# Patient Record
Sex: Female | Born: 1965 | Race: Black or African American | Hispanic: No | Marital: Single | Smoking: Never smoker
Health system: Southern US, Community
[De-identification: ages and names within clinical notes are randomized; demographics above are authoritative.]

## PROBLEM LIST (undated history)

## (undated) DIAGNOSIS — K219 Gastro-esophageal reflux disease without esophagitis: Secondary | ICD-10-CM

## (undated) DIAGNOSIS — D509 Iron deficiency anemia, unspecified: Secondary | ICD-10-CM

## (undated) DIAGNOSIS — E785 Hyperlipidemia, unspecified: Secondary | ICD-10-CM

## (undated) DIAGNOSIS — E119 Type 2 diabetes mellitus without complications: Secondary | ICD-10-CM

## (undated) DIAGNOSIS — I10 Essential (primary) hypertension: Secondary | ICD-10-CM

## (undated) HISTORY — DX: Iron deficiency anemia, unspecified: D50.9

## (undated) HISTORY — PX: BREAST BIOPSY: SHX20

## (undated) HISTORY — DX: Gastro-esophageal reflux disease without esophagitis: K21.9

## (undated) HISTORY — DX: Essential (primary) hypertension: I10

## (undated) HISTORY — PX: COLONOSCOPY WITH ESOPHAGOGASTRODUODENOSCOPY (EGD): SHX5779

## (undated) HISTORY — DX: Type 2 diabetes mellitus without complications: E11.9

## (undated) HISTORY — DX: Hyperlipidemia, unspecified: E78.5

---

## 2006-11-07 ENCOUNTER — Ambulatory Visit: Payer: Self-pay | Admitting: Obstetrics and Gynecology

## 2009-03-11 ENCOUNTER — Ambulatory Visit: Payer: Self-pay | Admitting: Family Medicine

## 2009-04-05 ENCOUNTER — Ambulatory Visit: Payer: Self-pay | Admitting: Family Medicine

## 2009-04-30 ENCOUNTER — Ambulatory Visit: Payer: Self-pay | Admitting: Gastroenterology

## 2009-08-31 ENCOUNTER — Ambulatory Visit: Payer: Self-pay | Admitting: Family Medicine

## 2009-09-01 ENCOUNTER — Ambulatory Visit: Payer: Self-pay | Admitting: Gastroenterology

## 2009-10-15 ENCOUNTER — Ambulatory Visit: Payer: Self-pay | Admitting: Unknown Physician Specialty

## 2010-01-03 ENCOUNTER — Ambulatory Visit: Payer: Self-pay | Admitting: Internal Medicine

## 2013-03-10 ENCOUNTER — Ambulatory Visit: Payer: Self-pay | Admitting: Gastroenterology

## 2013-03-13 ENCOUNTER — Ambulatory Visit: Payer: Self-pay | Admitting: Gastroenterology

## 2014-06-25 DIAGNOSIS — E1165 Type 2 diabetes mellitus with hyperglycemia: Secondary | ICD-10-CM | POA: Insufficient documentation

## 2014-06-25 DIAGNOSIS — E559 Vitamin D deficiency, unspecified: Secondary | ICD-10-CM | POA: Insufficient documentation

## 2015-09-09 ENCOUNTER — Other Ambulatory Visit: Payer: Self-pay | Admitting: Obstetrics and Gynecology

## 2016-05-26 ENCOUNTER — Encounter (INDEPENDENT_AMBULATORY_CARE_PROVIDER_SITE_OTHER): Payer: Self-pay

## 2016-06-23 ENCOUNTER — Encounter (INDEPENDENT_AMBULATORY_CARE_PROVIDER_SITE_OTHER): Payer: Self-pay

## 2016-06-23 ENCOUNTER — Ambulatory Visit (INDEPENDENT_AMBULATORY_CARE_PROVIDER_SITE_OTHER): Payer: Self-pay | Admitting: Vascular Surgery

## 2018-09-15 ENCOUNTER — Other Ambulatory Visit: Payer: Self-pay

## 2018-09-15 DIAGNOSIS — I10 Essential (primary) hypertension: Secondary | ICD-10-CM | POA: Diagnosis not present

## 2018-09-15 DIAGNOSIS — Z794 Long term (current) use of insulin: Secondary | ICD-10-CM | POA: Diagnosis not present

## 2018-09-15 DIAGNOSIS — R202 Paresthesia of skin: Secondary | ICD-10-CM | POA: Insufficient documentation

## 2018-09-15 DIAGNOSIS — I6381 Other cerebral infarction due to occlusion or stenosis of small artery: Secondary | ICD-10-CM | POA: Diagnosis not present

## 2018-09-15 DIAGNOSIS — R739 Hyperglycemia, unspecified: Secondary | ICD-10-CM | POA: Diagnosis present

## 2018-09-15 DIAGNOSIS — Z79899 Other long term (current) drug therapy: Secondary | ICD-10-CM | POA: Diagnosis not present

## 2018-09-15 DIAGNOSIS — Z8249 Family history of ischemic heart disease and other diseases of the circulatory system: Secondary | ICD-10-CM | POA: Diagnosis not present

## 2018-09-15 DIAGNOSIS — E1165 Type 2 diabetes mellitus with hyperglycemia: Secondary | ICD-10-CM | POA: Diagnosis not present

## 2018-09-15 DIAGNOSIS — E785 Hyperlipidemia, unspecified: Secondary | ICD-10-CM | POA: Insufficient documentation

## 2018-09-15 NOTE — ED Triage Notes (Addendum)
Pt reports tongue numbness since yesterday around 2pm; now with numbness to left upper and lower lip, left cheek and left eye has "a vision change"; "I wouldn't say blurry, just different"; pt says also she's feeling unbalanced; pt says she had a similar episode about 5 years ago; symptoms stayed for about 2 hours and then resolved on their own; did not seek medical attention at that time; she says this time they symptoms have lasted much longer and "have spread"; pt talking in complete coherent sentences;

## 2018-09-16 ENCOUNTER — Emergency Department: Payer: Managed Care, Other (non HMO)

## 2018-09-16 ENCOUNTER — Observation Stay: Payer: Managed Care, Other (non HMO)

## 2018-09-16 ENCOUNTER — Other Ambulatory Visit: Payer: Self-pay

## 2018-09-16 ENCOUNTER — Observation Stay
Admit: 2018-09-16 | Discharge: 2018-09-16 | Disposition: A | Discharge status: Home / Self Care | Payer: Managed Care, Other (non HMO) | Attending: Internal Medicine | Admitting: Internal Medicine

## 2018-09-16 ENCOUNTER — Encounter: Payer: Self-pay | Admitting: Internal Medicine

## 2018-09-16 ENCOUNTER — Observation Stay
Admission: EM | Admit: 2018-09-16 | Discharge: 2018-09-16 | Disposition: A | Discharge status: Home / Self Care | Payer: Managed Care, Other (non HMO) | Attending: Internal Medicine | Admitting: Internal Medicine

## 2018-09-16 DIAGNOSIS — R202 Paresthesia of skin: Secondary | ICD-10-CM

## 2018-09-16 DIAGNOSIS — I6381 Other cerebral infarction due to occlusion or stenosis of small artery: Secondary | ICD-10-CM

## 2018-09-16 DIAGNOSIS — R739 Hyperglycemia, unspecified: Secondary | ICD-10-CM

## 2018-09-16 DIAGNOSIS — I10 Essential (primary) hypertension: Secondary | ICD-10-CM

## 2018-09-16 LAB — RETICULOCYTES
Immature Retic Fract: 14.3 % (ref 2.3–15.9)
RBC.: 5.46 MIL/uL — ABNORMAL HIGH (ref 3.87–5.11)
Retic Count, Absolute: 102.6 10*3/uL (ref 19.0–186.0)
Retic Ct Pct: 1.9 % (ref 0.4–3.1)

## 2018-09-16 LAB — FERRITIN: Ferritin: 63 ng/mL (ref 11–307)

## 2018-09-16 LAB — IRON AND TIBC
IRON: 37 ug/dL (ref 28–170)
Saturation Ratios: 11 % (ref 10.4–31.8)
TIBC: 332 ug/dL (ref 250–450)
UIBC: 295 ug/dL

## 2018-09-16 LAB — LIPID PANEL
Cholesterol: 228 mg/dL — ABNORMAL HIGH (ref 0–200)
HDL: 41 mg/dL (ref >40)
LDL Cholesterol: 169 mg/dL — ABNORMAL HIGH (ref 0–99)
Total CHOL/HDL Ratio: 5.6 RATIO
Triglycerides: 90 mg/dL (ref <150)
VLDL: 18 mg/dL (ref 0–40)

## 2018-09-16 LAB — CBC WITH DIFFERENTIAL/PLATELET
Abs Immature Granulocytes: 0.02 10*3/uL (ref 0.00–0.07)
BASOS ABS: 0 10*3/uL (ref 0.0–0.1)
Basophils Relative: 0 %
Eosinophils Absolute: 0.1 10*3/uL (ref 0.0–0.5)
Eosinophils Relative: 1 %
HEMATOCRIT: 43 % (ref 36.0–46.0)
Hemoglobin: 13.4 g/dL (ref 12.0–15.0)
IMMATURE GRANULOCYTES: 0 %
Lymphocytes Relative: 34 %
Lymphs Abs: 1.9 10*3/uL (ref 0.7–4.0)
MCH: 24.8 pg — ABNORMAL LOW (ref 26.0–34.0)
MCHC: 31.2 g/dL (ref 30.0–36.0)
MCV: 79.6 fL — ABNORMAL LOW (ref 80.0–100.0)
Monocytes Absolute: 0.4 10*3/uL (ref 0.1–1.0)
Monocytes Relative: 6 %
NEUTROS PCT: 59 %
NRBC: 0 % (ref 0.0–0.2)
Neutro Abs: 3.2 10*3/uL (ref 1.7–7.7)
Platelets: 304 10*3/uL (ref 150–400)
RBC: 5.4 MIL/uL — ABNORMAL HIGH (ref 3.87–5.11)
RDW: 16 % — ABNORMAL HIGH (ref 11.5–15.5)
WBC: 5.6 10*3/uL (ref 4.0–10.5)

## 2018-09-16 LAB — GLUCOSE, CAPILLARY
Glucose-Capillary: 263 mg/dL — ABNORMAL HIGH (ref 70–99)
Glucose-Capillary: 268 mg/dL — ABNORMAL HIGH (ref 70–99)

## 2018-09-16 LAB — COMPREHENSIVE METABOLIC PANEL
ALBUMIN: 3.7 g/dL (ref 3.5–5.0)
ALK PHOS: 67 U/L (ref 38–126)
ALT: 18 U/L (ref 0–44)
ANION GAP: 9 (ref 5–15)
AST: 18 U/L (ref 15–41)
BILIRUBIN TOTAL: 0.6 mg/dL (ref 0.3–1.2)
BUN: 13 mg/dL (ref 6–20)
CALCIUM: 9.9 mg/dL (ref 8.9–10.3)
CO2: 27 mmol/L (ref 22–32)
Chloride: 99 mmol/L (ref 98–111)
Creatinine, Ser: 0.89 mg/dL (ref 0.44–1.00)
GFR calc Af Amer: >60 mL/min (ref >60)
GLUCOSE: 370 mg/dL — AB (ref 70–99)
Potassium: 4.4 mmol/L (ref 3.5–5.1)
Sodium: 135 mmol/L (ref 135–145)
TOTAL PROTEIN: 7.5 g/dL (ref 6.5–8.1)

## 2018-09-16 LAB — TRANSFERRIN: Transferrin: 223 mg/dL (ref 192–382)

## 2018-09-16 LAB — HEMOGLOBIN A1C
HEMOGLOBIN A1C: 12.4 % — AB (ref 4.8–5.6)
Mean Plasma Glucose: 309.18 mg/dL

## 2018-09-16 LAB — MAGNESIUM: Magnesium: 2 mg/dL (ref 1.7–2.4)

## 2018-09-16 LAB — TSH: TSH: 3.394 u[IU]/mL (ref 0.350–4.500)

## 2018-09-16 LAB — PHOSPHORUS: Phosphorus: 4 mg/dL (ref 2.5–4.6)

## 2018-09-16 MED ORDER — ONDANSETRON HCL 4 MG/2ML IJ SOLN: 4.0000 mg | Freq: Four times a day (QID) | INTRAMUSCULAR | Status: DC | PRN

## 2018-09-16 MED ORDER — SENNOSIDES-DOCUSATE SODIUM 8.6-50 MG PO TABS: 1.0000 | ORAL_TABLET | Freq: Every evening | ORAL | Status: DC | PRN

## 2018-09-16 MED ORDER — ACETAMINOPHEN 325 MG PO TABS: 650.0000 mg | ORAL_TABLET | ORAL | Status: DC | PRN

## 2018-09-16 MED ORDER — INSULIN ASPART 100 UNIT/ML ~~LOC~~ SOLN: 0.0000 [IU] | Freq: Every day | SUBCUTANEOUS | Status: DC

## 2018-09-16 MED ORDER — INSULIN GLARGINE 100 UNIT/ML ~~LOC~~ SOLN
30.0000 [IU] | Freq: Every day | SUBCUTANEOUS | Status: DC
  Filled 2018-09-16: qty 0.3

## 2018-09-16 MED ORDER — SODIUM CHLORIDE 0.9 % IV BOLUS
1000.0000 mL | Freq: Once | INTRAVENOUS | Status: AC
  Administered 2018-09-16: 1000 mL via INTRAVENOUS

## 2018-09-16 MED ORDER — LOSARTAN POTASSIUM 50 MG PO TABS
100.0000 mg | ORAL_TABLET | Freq: Every day | ORAL | Status: DC
  Administered 2018-09-16: 100 mg via ORAL
  Filled 2018-09-16: qty 2

## 2018-09-16 MED ORDER — INSULIN ASPART 100 UNIT/ML ~~LOC~~ SOLN
0.0000 [IU] | Freq: Three times a day (TID) | SUBCUTANEOUS | Status: DC
  Administered 2018-09-16 (×2): 8 [IU] via SUBCUTANEOUS
  Filled 2018-09-16 (×3): qty 1

## 2018-09-16 MED ORDER — ACETAMINOPHEN 650 MG RE SUPP: 650.0000 mg | RECTAL | Status: DC | PRN

## 2018-09-16 MED ORDER — ASPIRIN 81 MG PO TBEC: 81.0000 mg | DELAYED_RELEASE_TABLET | Freq: Every day | ORAL | 0 refills | Status: DC

## 2018-09-16 MED ORDER — AMLODIPINE BESYLATE 5 MG PO TABS
5.0000 mg | ORAL_TABLET | Freq: Every day | ORAL | Status: DC
  Administered 2018-09-16: 5 mg via ORAL
  Filled 2018-09-16: qty 1

## 2018-09-16 MED ORDER — ENOXAPARIN SODIUM 40 MG/0.4ML ~~LOC~~ SOLN
40.0000 mg | Freq: Two times a day (BID) | SUBCUTANEOUS | Status: DC
  Administered 2018-09-16: 40 mg via SUBCUTANEOUS
  Filled 2018-09-16: qty 0.4

## 2018-09-16 MED ORDER — SODIUM CHLORIDE 0.9 % IV SOLN: INTRAVENOUS | Status: DC

## 2018-09-16 MED ORDER — SODIUM CHLORIDE 0.9 % IV BOLUS: 1000.0000 mL | Freq: Once | INTRAVENOUS | Status: DC

## 2018-09-16 MED ORDER — STROKE: EARLY STAGES OF RECOVERY BOOK
Freq: Once | Status: AC
  Administered 2018-09-16: 07:00:00

## 2018-09-16 MED ORDER — ASPIRIN EC 81 MG PO TBEC: 81.0000 mg | DELAYED_RELEASE_TABLET | Freq: Every day | ORAL | Status: DC

## 2018-09-16 MED ORDER — ACETAMINOPHEN 160 MG/5ML PO SOLN
650.0000 mg | ORAL | Status: DC | PRN
  Filled 2018-09-16: qty 20.3

## 2018-09-16 MED ORDER — ASPIRIN 81 MG PO CHEW
324.0000 mg | CHEWABLE_TABLET | Freq: Once | ORAL | Status: AC
  Administered 2018-09-16: 324 mg via ORAL
  Filled 2018-09-16: qty 4

## 2018-09-16 MED ORDER — ROSUVASTATIN CALCIUM 20 MG PO TABS: 40.0000 mg | ORAL_TABLET | Freq: Every day | ORAL | Status: DC

## 2018-09-16 MED ORDER — ROSUVASTATIN CALCIUM 20 MG PO TABS: 20.0000 mg | ORAL_TABLET | Freq: Every day | ORAL | 0 refills | Status: AC

## 2018-09-16 MED ORDER — BISACODYL 5 MG PO TBEC: 5.0000 mg | DELAYED_RELEASE_TABLET | Freq: Every day | ORAL | Status: DC | PRN

## 2018-09-16 NOTE — ED Provider Notes (Signed)
[facility] Emergency Department Provider Note   ____________________________________________   First MD Initiated Contact with Patient 09/16/18 0132     (approximate)  I have reviewed the triage vital signs and the nursing notes.   HISTORY  Chief Complaint Numbness    HPI Janica Eldred is a 53 y.o. female who presents to the ED from home with a chief complaint of left facial numbness.  Patient reports tongue numbness since yesterday around 2 PM.  Last evening developed numbness to her left upper and lower lip, left cheek.  Reports left eye just looks different.  Denies diplopia, blurry vision.  States she feels unbalanced.  Patient had a similar episode 5 years ago but did not seek medical treatment and symptoms resolved after 2 hours.  Patient denies slurred speech, facial droop, facial weakness, extremity weakness, numbness or tingling.  Denies headache, neck pain, chest pain, shortness of breath, abdominal pain, nausea or vomiting.  Denies recent fever or chills.  Denies recent travel or trauma.   Past Medical History:  Diagnosis Date  . Diabetes mellitus without complication ([place])   . Hyperlipidemia   . Hypertension     There are no active problems to display for this patient.   Past Surgical History:  Procedure Laterality Date  . COLONOSCOPY WITH ESOPHAGOGASTRODUODENOSCOPY (EGD)      Prior to Admission medications   Medication Sig Start Date End Date Taking? Authorizing Provider  amLODipine (NORVASC) 5 MG tablet Take 5 mg by mouth daily.   Yes [provider]  losartan (COZAAR) 100 MG tablet Take 100 mg by mouth daily.   Yes [provider]  metFORMIN (GLUCOPHAGE) 500 MG tablet Take 1,000 mg by mouth 2 (two) times daily with a meal.    Yes [provider]  rosuvastatin (CRESTOR) 10 MG tablet Take 10 mg by mouth daily.   Yes [provider]  simvastatin (ZOCOR) 40 MG tablet Take 40 mg by mouth daily.     [provider]    Allergies Sunscreens and Victoza [liraglutide]  Family History  Problem Relation Age of Onset  . Diabetes Mother   . Hypertension Mother   . Stroke Mother     Social History Social History   Tobacco Use  . Smoking status: Never Smoker  . Smokeless tobacco: Never Used  Substance Use Topics  . Alcohol use: No  . Drug use: Never    Review of Systems  Constitutional: No fever/chills Eyes: No visual changes. ENT: No sore throat. Cardiovascular: Denies chest pain. Respiratory: Denies shortness of breath. Gastrointestinal: No abdominal pain.  No nausea, no vomiting.  No diarrhea.  No constipation. Genitourinary: Negative for dysuria. Musculoskeletal: Negative for back pain. Skin: Negative for rash. Neurological: Negative for headaches, focal weakness.  Positive for left facial numbness.   ____________________________________________   PHYSICAL EXAM:  VITAL SIGNS: ED Triage Vitals  Enc Vitals Group     BP 09/15/18 2311 (!) 184/113     Pulse Rate 09/15/18 2311 (!) 102     Resp 09/15/18 2311 18     Temp 09/15/18 2311 98.5 F (36.9 C)     Temp Source 09/15/18 2311 Oral     SpO2 09/15/18 2311 97 %     Weight 09/15/18 2312 300 lb (136.1 kg)     Height 09/15/18 2312 5\' 7"  (1.702 m)     Head Circumference --      Peak Flow --      Pain Score 09/15/18 2323  0     Pain Loc --      Pain Edu? --      Excl. in [place]? --     Constitutional: Alert and oriented. Well appearing and in no acute distress.  Texting on her cell phone. Eyes: Conjunctivae are normal. PERRL. EOMI. Head: Atraumatic. Nose: No congestion/rhinnorhea. Mouth/Throat: Mucous membranes are moist.  Oropharynx non-erythematous. Neck: No stridor.  No carotid bruits. Cardiovascular: Normal rate, regular rhythm. Grossly normal heart sounds.  Good peripheral circulation. Respiratory: Normal respiratory effort.  No retractions. Lungs CTAB. Gastrointestinal: Soft and nontender. No  distention. No abdominal bruits. No CVA tenderness. Musculoskeletal: No lower extremity tenderness nor edema.  No joint effusions. Neurologic: Alert and oriented x3.  CN II-XII grossly intact.  Normal speech and language.  5/5 motor strength bilateral face.  Diminished sensation left forehead, cheek and mouth.  5/5 motor strength and sensation in all extremities. No gait instability. Skin:  Skin is warm, dry and intact. No rash noted. Psychiatric: Mood and affect are normal. Speech and behavior are normal.  ____________________________________________   LABS (all labs ordered are listed, but only abnormal results are displayed)  Labs Reviewed  CBC WITH DIFFERENTIAL/PLATELET - Abnormal; Notable for the following components:      Result Value   RBC 5.40 (*)    MCV 79.6 (*)    MCH 24.8 (*)    RDW 16.0 (*)    All other components within normal limits  COMPREHENSIVE METABOLIC PANEL - Abnormal; Notable for the following components:   Glucose, Bld 370 (*)    All other components within normal limits   ____________________________________________  EKG  None ____________________________________________  RADIOLOGY  ED MD interpretation: No ICH on CT scan; MRI demonstrates acute lacunar infarct  Official radiology report(s): Ct Head Wo Contrast  Result Date: 09/16/2018 CLINICAL DATA:  Numbness to tongue, lips, and left cheek. EXAM: CT HEAD WITHOUT CONTRAST TECHNIQUE: Contiguous axial images were obtained from the base of the skull through the vertex without intravenous contrast. COMPARISON:  None. FINDINGS: Brain: No intracranial hemorrhage, mass effect, or midline shift. No hydrocephalus. The basilar cisterns are patent. No evidence of territorial infarct or acute ischemia. No extra-axial or intracranial fluid collection. Vascular: No hyperdense vessel. Skull: No fracture or focal lesion. Sinuses/Orbits: Paranasal sinuses and mastoid air cells are clear. The visualized orbits are  unremarkable. Other: None. IMPRESSION: Unremarkable noncontrast head CT. Electronically Signed   By: Keith Rake M.D.   On: 09/16/2018 00:29   Mr Brain Wo Contrast  Result Date: 09/16/2018 CLINICAL DATA:  Facial numbness, side not specified. Trouble with vision and unbalanced feeling. EXAM: MRI HEAD WITHOUT CONTRAST TECHNIQUE: Multiplanar, multiecho pulse sequences of the brain and surrounding structures were obtained without intravenous contrast. COMPARISON:  Head CT from earlier today FINDINGS: Brain: Best seen on coronal diffusion acquisition there is a 6 mm area of restricted diffusion in the right para median midbrain. As permitted by fast protocol technique the remainder of the brain shows no evident demyelination or remote ischemia. Patient does have history of hypertension, diabetes, and hyperlipidemia. Brain volume is normal and there is no masslike finding, hydrocephalus, or collection. Vascular: Grossly preserved major flow voids. Skull and upper cervical spine: Sclerotic signal in the left paramedian clivus that is benign-appearing and stable from a 2010 cervical MRI. Sinuses/Orbits: Negative IMPRESSION: Subcentimeter focus of restricted diffusion in the right midbrain consistent with an acute lacunar infarct. No prior ischemic injury or demyelination is seen in the remainder of the brain.  Electronically Signed   By: Monte Fantasia M.D.   On: 09/16/2018 04:51    ____________________________________________   PROCEDURES  Procedure(s) performed: None  Procedures  Critical Care performed: No  ____________________________________________   INITIAL IMPRESSION / ASSESSMENT AND PLAN / ED COURSE  As part of my medical decision making, I reviewed the following data within the [place] notes reviewed and incorporated, Labs reviewed, Old chart reviewed, Radiograph reviewed and Notes from prior ED visits   53 year old female with diabetes who presents with  left facial numbness without weakness.  Frontal diagnosis includes but is not limited to CVA, TIA, ICH, Bell's palsy, electrolyte, metabolic etiologies, etc.  Laboratory results demonstrate hyperglycemia.  CT head is negative for intracranial hemorrhage.  Will proceed with brain MRI.  Ordered IV fluid resuscitation for hyperglycemia but patient refused.   Clinical Course as of Sep 16 456  Mon Sep 16, 2018  0457 Updated patient of MRI result.  Will discuss with hospitalist to evaluate patient in the emergency department for admission.   [JS]    Clinical Course User Index [JS] Paulette Blanch, MD     ____________________________________________   FINAL CLINICAL IMPRESSION(S) / ED DIAGNOSES  Final diagnoses:  Paresthesia  Hyperglycemia  Essential hypertension  Acute lacunar infarction [facility])     ED Discharge Orders    None       Note:  This document was prepared using Dragon voice recognition software and may include unintentional dictation errors.    Paulette Blanch, MD 09/16/18 [phone]

## 2018-09-16 NOTE — Progress Notes (Signed)
*  PRELIMINARY RESULTS* Echocardiogram 2D Echocardiogram has been performed.  Rhonda Mccall 09/16/2018, 8:50 AM

## 2018-09-16 NOTE — ED Notes (Signed)
Iv team here 

## 2018-09-16 NOTE — Consult Note (Addendum)
Referring Physician: Loletha Grayer MD.    Chief Complaint: Left lower facial numbness and vision changes  HPI: Rhonda Mccall is an 53 y.o. female with past medical history of hypertension, hyperlipidemia, uncontrolled diabetes mellitus and morbid obesity presenting to the ED on 09/15/2018 with chief complaints of left lower face numbness including the lip and tongue associated with left vision changes. Patient report onset of symptoms on 09/14/2018 at around 1500 pm with sudden onset tongue numbness. Patient report similar episode 5 years ago with immediate resolution, however she noticed that her current symptoms were persistent with associated lower facial numbness, vision changes and imbalance.  Denies associated altered sensorium, speech abnormality, cranial nerve deficit, seizures, focal motor deficits, nausea or vomiting, dizziness, headache, syncope or LOC, or other focal neurological deficit. On arrival to the ED she was noted to have elevated blood pressure. Initial NIH stroke scale was 1. Initial CT head did not show any acute intracranial abnormality. She was therefore admitted for further stroke work up. Follow up MRI brain showed acute lacunar infarct in the right midbrain.  Date last known well: Date: 09/14/2018 Time last known well: Time: 15:00 tPA Given: No: outside time window, non-disabling symptoms  Past Medical History:  Diagnosis Date  . Diabetes mellitus without complication ([place])   . Hyperlipidemia   . Hypertension     Past Surgical History:  Procedure Laterality Date  . COLONOSCOPY WITH ESOPHAGOGASTRODUODENOSCOPY (EGD)      Family History  Problem Relation Age of Onset  . Diabetes Mother   . Hypertension Mother   . Stroke Mother    Social History:  reports that she has never smoked. She has never used smokeless tobacco. She reports that she does not drink alcohol or use drugs.  Allergies:  Allergies  Allergen Reactions  . Sunscreens Hives  . Victoza  [Liraglutide] Other (See Comments)    Yeast infections    Medications:  I have reviewed the patient's current medications. Prior to Admission:  Medications Prior to Admission  Medication Sig Dispense Refill Last Dose  . amLODipine (NORVASC) 5 MG tablet Take 5 mg by mouth daily.   09/15/2018 at 1000  . Dulaglutide 1.5 MG/0.5ML SOPN Inject 1.5 mg into the skin once a week.   09/09/2018 at Unknown  . Insulin Degludec 200 UNIT/ML SOPN Inject 30 Units into the skin daily.   09/15/2018 at 0800  . losartan (COZAAR) 100 MG tablet Take 100 mg by mouth daily.   09/15/2018 at 1000  . metFORMIN (GLUCOPHAGE) 500 MG tablet Take 1,000 mg by mouth 2 (two) times daily with a meal.    09/15/2018 at 1000  . rosuvastatin (CRESTOR) 10 MG tablet Take 10 mg by mouth daily.   09/15/2018 at 1000   Scheduled: . amLODipine  5 mg Oral Daily  . enoxaparin (LOVENOX) injection  40 mg Subcutaneous BID  . insulin aspart  0-15 Units Subcutaneous TID WC  . insulin aspart  0-5 Units Subcutaneous QHS  . insulin glargine  30 Units Subcutaneous QHS  . losartan  100 mg Oral Daily  . rosuvastatin  40 mg Oral Daily    ROS: History obtained from the patient   General ROS: negative for - chills, fatigue, fever, night sweats, weight gain or weight loss Psychological ROS: negative for - behavioral disorder, hallucinations, memory difficulties, mood swings or suicidal ideation Ophthalmic ROS: negative for -  double vision, eye pain or loss of vision. Positive for blurry vision. ENT ROS: negative for - epistaxis, nasal  discharge, oral lesions, sore throat, tinnitus or vertigo Allergy and Immunology ROS: negative for - hives or itchy/watery eyes Hematological and Lymphatic ROS: negative for - bleeding problems, bruising or swollen lymph nodes Endocrine ROS: negative for - galactorrhea, hair pattern changes, polydipsia/polyuria or temperature intolerance Respiratory ROS: negative for - cough, hemoptysis, shortness of breath or  wheezing Cardiovascular ROS: negative for - chest pain, dyspnea on exertion, edema or irregular heartbeat Gastrointestinal ROS: negative for - abdominal pain, diarrhea, hematemesis, nausea/vomiting or stool incontinence Genito-Urinary ROS: negative for - dysuria, hematuria, incontinence or urinary frequency/urgency Musculoskeletal ROS: negative for - joint swelling or muscular weakness Neurological ROS: as noted in HPI Dermatological ROS: negative for rash and skin lesion changes  Physical Examination: Blood pressure (!) 152/91, pulse 83, temperature 97.8 F (36.6 C), resp. rate 14, height 5\' 7"  (1.702 m), weight 129.7 kg, last menstrual period 08/30/2018, SpO2 98 %.   HEENT-  Normocephalic, no lesions, without obvious abnormality.  Normal external eye and conjunctiva.  Normal TM's bilaterally.  Normal auditory canals and external ears. Normal external nose, mucus membranes and septum.  Normal pharynx. Cardiovascular- S1, S2 normal, pulses palpable throughout   Lungs- chest clear, no wheezing, rales, normal symmetric air entry Abdomen- soft, non-tender; bowel sounds normal; no masses,  no organomegaly Extremities- no edema Lymph-no adenopathy palpable Musculoskeletal-no joint tenderness, deformity or swelling Skin-warm and dry, no hyperpigmentation, vitiligo, or suspicious lesions  Neurological Exam   Mental Status: Alert, oriented, thought content appropriate.  Speech fluent without evidence of aphasia.  Able to follow 3 step commands without difficulty. Attention span and concentration seemed appropriate  Cranial Nerves: II: Discs flat bilaterally; Visual fields grossly normal, pupils equal, round, reactive to light and accommodation III,IV, VI: ptosis not present, extra-ocular motions intact bilaterally V,VII: smile symmetric, facial light touch sensation intact VIII: hearing normal bilaterally IX,X: gag reflex present XI: bilateral shoulder shrug XII: midline tongue  extension Motor: Right :  Upper extremity   5/5 Without pronator drift      Left: Upper extremity   5/5 without pronator drift Right:   Lower extremity   5/5                                          Left: Lower extremity   5/5 Tone and bulk:normal tone throughout; no atrophy noted Sensory: Pinprick and light touch intact bilaterally Deep Tendon Reflexes: 2+ and symmetric throughout Plantars: Right: downgoing                              Left: downgoing Cerebellar: Finger-to-nose testing intact bilaterally. Heel to shin testing normal bilaterally Gait: not tested due to safety concerns  Data Reviewed  Laboratory Studies:  Basic Metabolic Panel: Recent Labs  Lab 09/15/18 2342  NA 135  K 4.4  CL 99  CO2 27  GLUCOSE 370*  BUN 13  CREATININE 0.89  CALCIUM 9.9  MG 2.0  PHOS 4.0    Liver Function Tests: Recent Labs  Lab 09/15/18 2342  AST 18  ALT 18  ALKPHOS 67  BILITOT 0.6  PROT 7.5  ALBUMIN 3.7   No results for input(s): LIPASE, AMYLASE in the last 168 hours. No results for input(s): AMMONIA in the last 168 hours.  CBC: Recent Labs  Lab 09/15/18 2342  WBC 5.6  NEUTROABS 3.2  HGB 13.4  HCT 43.0  MCV 79.6*  PLT 304    Cardiac Enzymes: No results for input(s): CKTOTAL, CKMB, CKMBINDEX, TROPONINI in the last 168 hours.  BNP: Invalid input(s): POCBNP  CBG: Recent Labs  Lab 09/16/18 0914  GLUCAP 263*    Microbiology: No results found for this or any previous visit.  Coagulation Studies: No results for input(s): LABPROT, INR in the last 72 hours.  Urinalysis: No results for input(s): COLORURINE, LABSPEC, PHURINE, GLUCOSEU, HGBUR, BILIRUBINUR, KETONESUR, PROTEINUR, UROBILINOGEN, NITRITE, LEUKOCYTESUR in the last 168 hours.  Invalid input(s): APPERANCEUR  Lipid Panel: No results found for: CHOL, TRIG, HDL, CHOLHDL, VLDL, LDLCALC  HgbA1C: No results found for: HGBA1C  Urine Drug Screen:  No results found for: LABOPIA, COCAINSCRNUR, LABBENZ,  AMPHETMU, THCU, LABBARB  Alcohol Level: No results for input(s): ETH in the last 168 hours.  Other results: EKG: normal EKG, normal sinus rhythm, unchanged from previous tracings.  Imaging: Ct Head Wo Contrast  Result Date: 09/16/2018 CLINICAL DATA:  Numbness to tongue, lips, and left cheek. EXAM: CT HEAD WITHOUT CONTRAST TECHNIQUE: Contiguous axial images were obtained from the base of the skull through the vertex without intravenous contrast. COMPARISON:  None. FINDINGS: Brain: No intracranial hemorrhage, mass effect, or midline shift. No hydrocephalus. The basilar cisterns are patent. No evidence of territorial infarct or acute ischemia. No extra-axial or intracranial fluid collection. Vascular: No hyperdense vessel. Skull: No fracture or focal lesion. Sinuses/Orbits: Paranasal sinuses and mastoid air cells are clear. The visualized orbits are unremarkable. Other: None. IMPRESSION: Unremarkable noncontrast head CT. Electronically Signed   By: Keith Rake M.D.   On: 09/16/2018 00:29   Mr Brain Wo Contrast  Result Date: 09/16/2018 CLINICAL DATA:  Facial numbness, side not specified. Trouble with vision and unbalanced feeling. EXAM: MRI HEAD WITHOUT CONTRAST TECHNIQUE: Multiplanar, multiecho pulse sequences of the brain and surrounding structures were obtained without intravenous contrast. COMPARISON:  Head CT from earlier today FINDINGS: Brain: Best seen on coronal diffusion acquisition there is a 6 mm area of restricted diffusion in the right para median midbrain. As permitted by fast protocol technique the remainder of the brain shows no evident demyelination or remote ischemia. Patient does have history of hypertension, diabetes, and hyperlipidemia. Brain volume is normal and there is no masslike finding, hydrocephalus, or collection. Vascular: Grossly preserved major flow voids. Skull and upper cervical spine: Sclerotic signal in the left paramedian clivus that is benign-appearing and stable  from a 2010 cervical MRI. Sinuses/Orbits: Negative IMPRESSION: Subcentimeter focus of restricted diffusion in the right midbrain consistent with an acute lacunar infarct. No prior ischemic injury or demyelination is seen in the remainder of the brain. Electronically Signed   By: Monte Fantasia M.D.   On: 09/16/2018 04:51   US Carotid Bilateral (at [facility] Only)  Result Date: 09/16/2018 CLINICAL DATA:  Lacunar infarction EXAM: BILATERAL CAROTID DUPLEX ULTRASOUND TECHNIQUE: Pearline Cables scale imaging, color Doppler and duplex ultrasound were performed of bilateral carotid and vertebral arteries in the neck. COMPARISON:  None. FINDINGS: Criteria: Quantification of carotid stenosis is based on velocity parameters that correlate the residual internal carotid diameter with NASCET-based stenosis levels, using the diameter of the distal internal carotid lumen as the denominator for stenosis measurement. The following velocity measurements were obtained: RIGHT ICA: 50 cm/sec CCA: 73 cm/sec SYSTOLIC ICA/CCA RATIO:  0.7 ECA: 66 cm/sec LEFT ICA: 53 cm/sec CCA: 64 cm/sec SYSTOLIC ICA/CCA RATIO:  0.8 ECA: 43 cm/sec RIGHT CAROTID ARTERY: Mild intimal thickening in the upper common carotid  and bulb. Low resistance internal carotid Doppler pattern is preserved. RIGHT VERTEBRAL ARTERY:  Antegrade. LEFT CAROTID ARTERY: Mild intimal thickening in the upper common carotid artery and bulb. Low resistance internal carotid Doppler pattern is preserved. LEFT VERTEBRAL ARTERY:  Antegrade. IMPRESSION: Less than 50% stenosis in the right and left internal carotid arteries. Electronically Signed   By: Marybelle Killings M.D.   On: 09/16/2018 08:39   Patient seen and examined.  Clinical course and management discussed.  Necessary edits performed.  I agree with the above.  Assessment and plan of care developed and discussed below.    Assessment: 53 y.o. female with past medical history of hypertension, hyperlipidemia, uncontrolled diabetes mellitus  and morbid obesity presenting to the ED on 09/15/2018 with chief complaints of left lower face numbness including the lip and tongue associated with left vision changes. Symptoms now resolved. MRI brain reviewed and showed acute lacunar infarct in the right midbrain. Etiology likely small vessel disease. US carotid bilateral did not show hemodynamically significant stenosis. Patient report that she was not taking any anticoagulant or antiplatelet prior to this episode. She also has uncontrolled blood pressure with elevated reading on presentation and uncontrolled diabetes mellitus with most recent hemoglobin A1c of 8.0.  Current A1c and LDL pending.  Echocardiogram pending.    Stroke Risk Factors - diabetes mellitus, family history, hyperlipidemia and hypertension  Plan: 1. HgbA1c, fasting lipid panel 2. Start Aspirin 81 mg/day with intensive management of vascular risk factor to keep systolic BP (SBP) <423 mm Hg (130 mm Hg if diabetic) 3. Lipid management with goal low density lipoprotein (LDL) <70 mg/dl 4. Glycemic control with goal HgbA1c <7 5. PT consult, OT consult, Speech consult 6. Echocardiogram pending 7. NPO until RN stroke swallow screen 8. Telemetry monitoring 9. Frequent neuro checks 10. Patient to follow up with neurology on an outpatient basis.    This patient was staffed with Dr. Magda Paganini, [place] Mince who personally evaluated patient, reviewed documentation and agreed with assessment and plan of care as above.  Rufina Falco, DNP, FNP-BC Board certified Nurse Practitioner Neurology Department  09/16/2018, 11:14 AM    Alexis Goodell, MD Neurology [phone]  09/16/2018  12:54 PM

## 2018-09-16 NOTE — Progress Notes (Signed)
Reviewed AVS with pt, stroke education handout provided. Pt verbalized understanding.

## 2018-09-16 NOTE — H&P (Signed)
[facility] [place] NAME: Rhonda Mccall    MR#:  [id number]  DATE OF BIRTH:  1966-03-05  DATE OF ADMISSION:  09/16/2018  PRIMARY CARE PHYSICIAN: Patient, No Pcp Per   REQUESTING/REFERRING PHYSICIAN: Paulette Blanch, MD  CHIEF COMPLAINT:   Chief Complaint  Patient presents with  . Numbness    HISTORY OF PRESENT ILLNESS:  Rhonda Mccall  is a 53 y.o. female with a known history of morbid obesity, T2IDDM, HTN, HLD p/w tongue/L facial numbness, MRI brain (+) R midbrain lacunar infarct. Denies focal weakness of any variety. She states her sugar has been persistently elevated for the past week or so. She has been feeling fatigued/tired, but was otherwise in her usual state of health up until Saturday (09/14/2018) afternoon. @~1500PM, pt states she developed sudden-onset tongue numbness. She states she has had this issue the past, but it would typically resolve spontaneously after several hours. She denies pain/spasm/fasciculation of the tongue, tongue weakness, slurred speech or difficulty speaking. She states the numbness was persistent, however. She cannot tell me much about the progression of her symptoms between Saturday (09/14/2018) evening and Sunday (09/15/2018) evening, but states that she noticed on Sunday evening that the numbness began to extend laterally, to involve the face/skin over the L mandible, L maxilla and lateral to the L eye. She states her eyelid "feels funny", but denies blurred/double vision or eye pain; there is no strabismus or gaze palsy on exam. She denies numbness over the L forehead. Apart from above-mentioned tongue + L facial sensory deficit, pt's neurological exam is non-focal. She is comfortable and otherwise w/o complaint. MRI brain (+) "Subcentimeter focus of restricted diffusion in the right midbrain consistent with an acute lacunar infarct."  PAST MEDICAL HISTORY:   Past Medical History:  Diagnosis Date  . Diabetes  mellitus without complication ([place])   . Hyperlipidemia   . Hypertension     PAST SURGICAL HISTORY:   Past Surgical History:  Procedure Laterality Date  . COLONOSCOPY WITH ESOPHAGOGASTRODUODENOSCOPY (EGD)      SOCIAL HISTORY:   Social History   Tobacco Use  . Smoking status: Never Smoker  . Smokeless tobacco: Never Used  Substance Use Topics  . Alcohol use: No    FAMILY HISTORY:   Family History  Problem Relation Age of Onset  . Diabetes Mother   . Hypertension Mother   . Stroke Mother     DRUG ALLERGIES:   Allergies  Allergen Reactions  . Sunscreens Hives  . Victoza [Liraglutide] Other (See Comments)    Yeast infections    REVIEW OF SYSTEMS:   Review of Systems  Constitutional: Positive for malaise/fatigue. Negative for chills, diaphoresis, fever and weight loss.  HENT: Negative for congestion, ear pain, hearing loss, nosebleeds, sinus pain, sore throat and tinnitus.   Eyes: Negative for blurred vision, double vision and photophobia.  Respiratory: Negative for cough, hemoptysis, sputum production, shortness of breath and wheezing.   Cardiovascular: Negative for chest pain, palpitations, orthopnea, claudication, leg swelling and PND.  Gastrointestinal: Negative for abdominal pain, blood in stool, constipation, diarrhea, heartburn, melena, nausea and vomiting.  Genitourinary: Negative for dysuria, frequency, hematuria and urgency.  Musculoskeletal: Negative for back pain, joint pain, myalgias and neck pain.  Skin: Negative for itching and rash.  Neurological: Positive for sensory change. Negative for dizziness, tingling, tremors, speech change, focal weakness, seizures, loss of consciousness, weakness and headaches.  Psychiatric/Behavioral: Negative for depression and memory loss. The patient  is not nervous/anxious and does not have insomnia.    MEDICATIONS AT HOME:   Prior to Admission medications   Medication Sig Start Date End Date Taking? Authorizing  Provider  amLODipine (NORVASC) 5 MG tablet Take 5 mg by mouth daily.   Yes [provider]  Dulaglutide 1.5 MG/0.5ML SOPN Inject 1.5 mg into the skin once a week. 04/05/18  Yes [provider]  Insulin Degludec 200 UNIT/ML SOPN Inject 30 Units into the skin daily. 11/29/17  Yes [provider]  losartan (COZAAR) 100 MG tablet Take 100 mg by mouth daily.   Yes [provider]  metFORMIN (GLUCOPHAGE) 500 MG tablet Take 1,000 mg by mouth 2 (two) times daily with a meal.    Yes [provider]  rosuvastatin (CRESTOR) 10 MG tablet Take 10 mg by mouth daily.   Yes [provider]      VITAL SIGNS:  Blood pressure (!) 151/93, pulse 92, temperature 98.5 F (36.9 C), temperature source Oral, resp. rate 16, height 5\' 7"  (1.702 m), weight 136.1 kg, last menstrual period 08/30/2018, SpO2 97 %.  PHYSICAL EXAMINATION:  Physical Exam Constitutional:      General: She is awake. She is not in acute distress.    Appearance: Normal appearance. She is well-developed and normal weight. She is not ill-appearing, toxic-appearing or diaphoretic.     Interventions: She is not intubated. HENT:     Head: Atraumatic.     Mouth/Throat:     Mouth: Mucous membranes are dry.     Pharynx: Oropharynx is clear.  Eyes:     General: Lids are normal. No scleral icterus.    Extraocular Movements: Extraocular movements intact.     Conjunctiva/sclera: Conjunctivae normal.  Neck:     Musculoskeletal: Neck supple.  Cardiovascular:     Rate and Rhythm: Normal rate and regular rhythm.  No extrasystoles are present.    Heart sounds: Normal heart sounds, S1 normal and S2 normal. Heart sounds not distant. No murmur. No friction rub. No gallop. No S3 or S4 sounds.   Pulmonary:     Effort: No tachypnea, bradypnea, accessory muscle usage, prolonged expiration, respiratory distress or retractions. She is not intubated.     Breath sounds: Normal breath sounds and air entry. No  stridor, decreased air movement or transmitted upper airway sounds. No decreased breath sounds, wheezing, rhonchi or rales.  Abdominal:     General: Bowel sounds are decreased. There is no distension.     Palpations: Abdomen is soft.     Tenderness: There is no abdominal tenderness. There is no guarding or rebound.  Musculoskeletal: Normal range of motion.        General: No swelling or tenderness.     Right lower leg: No edema.     Left lower leg: No edema.  Lymphadenopathy:     Cervical: No cervical adenopathy.  Skin:    General: Skin is warm and dry.     Findings: No erythema or rash.  Neurological:     Mental Status: She is alert and oriented to person, place, and time.     Cranial Nerves: Cranial nerves are intact.     Sensory: Sensory deficit present.     Motor: Motor function is intact.     Coordination: Coordination is intact.  Psychiatric:        Attention and Perception: Attention and perception normal.        Mood and Affect: Mood normal.  Speech: Speech normal.        Behavior: Behavior normal. Behavior is cooperative.        Thought Content: Thought content normal.        Cognition and Memory: Cognition and memory normal.        Judgment: Judgment normal.    (+) sensory deficit (crude touch) L face (w/ sparing of L forehead). Strength, reflex, cerebellar, CN exams otherwise WNL. LABORATORY PANEL:   CBC Recent Labs  Lab 09/15/18 2342  WBC 5.6  HGB 13.4  HCT 43.0  PLT 304   ------------------------------------------------------------------------------------------------------------------  Chemistries  Recent Labs  Lab 09/15/18 2342  NA 135  K 4.4  CL 99  CO2 27  GLUCOSE 370*  BUN 13  CREATININE 0.89  CALCIUM 9.9  AST 18  ALT 18  ALKPHOS 67  BILITOT 0.6   ------------------------------------------------------------------------------------------------------------------  Cardiac Enzymes No results for input(s): TROPONINI in the last 168  hours. ------------------------------------------------------------------------------------------------------------------  RADIOLOGY:  Ct Head Wo Contrast  Result Date: 09/16/2018 CLINICAL DATA:  Numbness to tongue, lips, and left cheek. EXAM: CT HEAD WITHOUT CONTRAST TECHNIQUE: Contiguous axial images were obtained from the base of the skull through the vertex without intravenous contrast. COMPARISON:  None. FINDINGS: Brain: No intracranial hemorrhage, mass effect, or midline shift. No hydrocephalus. The basilar cisterns are patent. No evidence of territorial infarct or acute ischemia. No extra-axial or intracranial fluid collection. Vascular: No hyperdense vessel. Skull: No fracture or focal lesion. Sinuses/Orbits: Paranasal sinuses and mastoid air cells are clear. The visualized orbits are unremarkable. Other: None. IMPRESSION: Unremarkable noncontrast head CT. Electronically Signed   By: Keith Rake M.D.   On: 09/16/2018 00:29   Mr Brain Wo Contrast  Result Date: 09/16/2018 CLINICAL DATA:  Facial numbness, side not specified. Trouble with vision and unbalanced feeling. EXAM: MRI HEAD WITHOUT CONTRAST TECHNIQUE: Multiplanar, multiecho pulse sequences of the brain and surrounding structures were obtained without intravenous contrast. COMPARISON:  Head CT from earlier today FINDINGS: Brain: Best seen on coronal diffusion acquisition there is a 6 mm area of restricted diffusion in the right para median midbrain. As permitted by fast protocol technique the remainder of the brain shows no evident demyelination or remote ischemia. Patient does have history of hypertension, diabetes, and hyperlipidemia. Brain volume is normal and there is no masslike finding, hydrocephalus, or collection. Vascular: Grossly preserved major flow voids. Skull and upper cervical spine: Sclerotic signal in the left paramedian clivus that is benign-appearing and stable from a 2010 cervical MRI. Sinuses/Orbits: Negative  IMPRESSION: Subcentimeter focus of restricted diffusion in the right midbrain consistent with an acute lacunar infarct. No prior ischemic injury or demyelination is seen in the remainder of the brain. Electronically Signed   By: Monte Fantasia M.D.   On: 09/16/2018 04:51   IMPRESSION AND PLAN:   A/P: 26F w/ PMHx morbid obesity, T2IDDM, HTN, HLD p/w tongue/L facial numbness, R midbrain lacunar infarct. Hyperglycemia, microcytosis. -Tongue/L facial numbness, R midbrain lacunar infarct: Pt p/w 2-3d Hx tongue numbness progressing to L-sided facial numbness (sparing the L forehead). Pure sensory deficits, (-) motor deficits. (+) sensory deficit (crude touch) L face (w/ sparing of L forehead). Strength, reflex, cerebellar, CN exams otherwise WNL. MRI brain (+) "Subcentimeter focus of restricted diffusion in the right midbrain consistent with an acute lacunar infarct." Discussed the importance of tight blood pressure, glycemic and lipid control for prevention of future lacunar (and large-vessel) infarcts. HbA1c, Lipids, A1c. Crestor increased to 40mg . Though the traditional mgmt paradigm for large-vessel  ischemic CVA does not apply to lacuncar infarction, I have nonetheless placed pt on CVA pathway and ordered for carotid U/S and Echo, given the shared risk profile. NIHSS, neuro checks. [id number]. Not ordered for therapy services (no motor/strength deficits, EOMI intact, speech WNL). Deficits expected to improve w/ time. -Hyperglycemia, T2IDDM: SSI, Lantus. Hold Trulicity. -Microcytosis: Iron studies. -c/w home meds/formulary subs. -FEN/GI: Cardiac diabetic diet. -DVT PPx: Lovenox. -Code status: Full code. -Disposition: Observation, < 2 midnights.   All the records are reviewed and case discussed with ED provider. Management plans discussed with the patient, family and they are in agreement.  CODE STATUS: Full code.  TOTAL TIME TAKING CARE OF THIS PATIENT: 75 minutes.    Arta Silence M.D on  09/16/2018 at 6:17 AM  Between 7am to 6pm - Pager - [phone]  After 6pm go to [email or web address] - Proofreader  [facility]  Office  [phone]  CC: Primary care physician; Patient, No Pcp Per   Note: This dictation was prepared with Dragon dictation along with smaller phrase technology. Any transcriptional errors that result from this process are unintentional.

## 2018-09-16 NOTE — ED Notes (Signed)
Report from vanessa, rn

## 2018-09-16 NOTE — ED Notes (Signed)
Waiting for MRI

## 2018-09-16 NOTE — ED Notes (Signed)
Pt returned from MRI °

## 2018-09-16 NOTE — ED Notes (Signed)
Pt changing into gown in preparation for MRI.

## 2018-09-16 NOTE — Discharge Summary (Signed)
[facility] [facility] [place] NAME: Rhonda Mccall    MR#:  [id number]  DATE OF BIRTH:  1966/08/26  DATE OF ADMISSION:  09/16/2018 ADMITTING PHYSICIAN: Arta Silence, MD  DATE OF DISCHARGE: 09/16/2017  PRIMARY CARE PHYSICIAN: Patient, No Pcp Per    ADMISSION DIAGNOSIS:  Paresthesia [R20.2] Hyperglycemia [R73.9] Acute lacunar infarction ([place]) [I63.81] Essential hypertension [I10]  DISCHARGE DIAGNOSIS:  Active Problems:   Lacunar infarction ([place])   SECONDARY DIAGNOSIS:   Past Medical History:  Diagnosis Date  . Diabetes mellitus without complication ([place])   . Hyperlipidemia   . Hypertension     HOSPITAL COURSE:   1.  Acute lacunar infarct in the right midbrain.  This is small vessel disease.  Controlling blood pressure and diabetes is the key.  Restart aspirin 81 mg daily.  Increase Crestor to 20 mg daily.  Patient seen by neurology.  No need for physical therapy and Occupational Therapy evaluations because the patient does not have any weakness one side versus the other.  The patient has paresthesias of the left side of her face and tongue. Lipid panel added onto the morning labs and hemoglobin A1c added onto the morning labs and currently pending at this time. 2.  Essential hypertension.  Blood pressure elevated on presentation but now much improved on her usual medications.  Compliance with medications advised. 3.  Type 2 diabetes mellitus.  Hemoglobin A1c pending.  Continue usual home regimen and follow-up with endocrinology as outpatient. 4.  Hyperlipidemia unspecified.  Lipid profile added on to morning labs.  Increase Crestor to 20 mg daily   DISCHARGE CONDITIONS:   Follow-up neurology as outpatient Follow-up with cardiology clinic medical team  CONSULTS OBTAINED:  Treatment Team:  Arta Silence, MD Alexis Goodell, MD  DRUG ALLERGIES:   Allergies  Allergen Reactions  . Sunscreens Hives  . Victoza [Liraglutide]  Other (See Comments)    Yeast infections    DISCHARGE MEDICATIONS:   Allergies as of 09/16/2018      Reactions   Sunscreens Hives   Victoza [liraglutide] Other (See Comments)   Yeast infections      Medication List    TAKE these medications   amLODipine 5 MG tablet Commonly known as:  NORVASC Take 5 mg by mouth daily.   aspirin 81 MG EC tablet Take 1 tablet (81 mg total) by mouth daily. Start taking on:  September 17, 2018   Dulaglutide 1.5 MG/0.5ML Sopn Inject 1.5 mg into the skin once a week.   Insulin Degludec 200 UNIT/ML Sopn Inject 30 Units into the skin daily.   losartan 100 MG tablet Commonly known as:  COZAAR Take 100 mg by mouth daily.   metFORMIN 500 MG tablet Commonly known as:  GLUCOPHAGE Take 1,000 mg by mouth 2 (two) times daily with a meal.   rosuvastatin 20 MG tablet Commonly known as:  CRESTOR Take 1 tablet (20 mg total) by mouth daily. What changed:    medication strength  how much to take        DISCHARGE INSTRUCTIONS:   Follow-up [facility] clinic medical team Follow-up [facility] clinic neurology  If you experience worsening of your admission symptoms, develop shortness of breath, life threatening emergency, suicidal or homicidal thoughts you must seek medical attention immediately by calling 911 or calling your MD immediately  if symptoms less severe.  You Must read complete instructions/literature along with all the possible adverse reactions/side effects for all the Medicines you take and that have  been prescribed to you. Take any new Medicines after you have completely understood and accept all the possible adverse reactions/side effects.   Please note  You were cared for by a hospitalist during your hospital stay. If you have any questions about your discharge medications or the care you received while you were in the hospital after you are discharged, you can call the unit and asked to speak with the hospitalist on call if the  hospitalist that took care of you is not available. Once you are discharged, your primary care physician will handle any further medical issues. Please note that NO REFILLS for any discharge medications will be authorized once you are discharged, as it is imperative that you return to your primary care physician (or establish a relationship with a primary care physician if you do not have one) for your aftercare needs so that they can reassess your need for medications and monitor your lab values.    Today   CHIEF COMPLAINT:   Chief Complaint  Patient presents with  . Numbness    HISTORY OF PRESENT ILLNESS:  Rhonda Mccall  is a 53 y.o. female presents with numbness to the tongue and left side of the face.   VITAL SIGNS:  Blood pressure 112/77, pulse 93, temperature (!) 97.5 F (36.4 C), temperature source Oral, resp. rate 20, height 5\' 7"  (1.702 m), weight 129.7 kg, last menstrual period 08/30/2018, SpO2 97 %.    PHYSICAL EXAMINATION:  GENERAL:  53 y.o.-year-old patient lying in the bed with no acute distress.  EYES: Pupils equal, round, reactive to light and accommodation. No scleral icterus. Extraocular muscles intact.  HEENT: Head atraumatic, normocephalic. Oropharynx and nasopharynx clear.  NECK:  Supple, no jugular venous distention. No thyroid enlargement, no tenderness.  LUNGS: Normal breath sounds bilaterally, no wheezing, rales,rhonchi or crepitation. No use of accessory muscles of respiration.  CARDIOVASCULAR: S1, S2 normal. No murmurs, rubs, or gallops.  ABDOMEN: Soft, non-tender, non-distended. Bowel sounds present. No organomegaly or mass.  EXTREMITIES: No pedal edema, cyanosis, or clubbing.  NEUROLOGIC: Cranial nerves II through XII are intact. Muscle strength 5/5 in all extremities. Sensation intact.  PSYCHIATRIC: The patient is alert and oriented x 3.  SKIN: No obvious rash, lesion, or ulcer.   DATA REVIEW:   CBC Recent Labs  Lab 09/15/18 2342  WBC 5.6  HGB  13.4  HCT 43.0  PLT 304    Chemistries  Recent Labs  Lab 09/15/18 2342  NA 135  K 4.4  CL 99  CO2 27  GLUCOSE 370*  BUN 13  CREATININE 0.89  CALCIUM 9.9  MG 2.0  AST 18  ALT 18  ALKPHOS 67  BILITOT 0.6     RADIOLOGY:  Ct Head Wo Contrast  Result Date: 09/16/2018 CLINICAL DATA:  Numbness to tongue, lips, and left cheek. EXAM: CT HEAD WITHOUT CONTRAST TECHNIQUE: Contiguous axial images were obtained from the base of the skull through the vertex without intravenous contrast. COMPARISON:  None. FINDINGS: Brain: No intracranial hemorrhage, mass effect, or midline shift. No hydrocephalus. The basilar cisterns are patent. No evidence of territorial infarct or acute ischemia. No extra-axial or intracranial fluid collection. Vascular: No hyperdense vessel. Skull: No fracture or focal lesion. Sinuses/Orbits: Paranasal sinuses and mastoid air cells are clear. The visualized orbits are unremarkable. Other: None. IMPRESSION: Unremarkable noncontrast head CT. Electronically Signed   By: Keith Rake M.D.   On: 09/16/2018 00:29   Mr Brain Wo Contrast  Result Date: 09/16/2018 CLINICAL DATA:  Facial numbness, side not specified. Trouble with vision and unbalanced feeling. EXAM: MRI HEAD WITHOUT CONTRAST TECHNIQUE: Multiplanar, multiecho pulse sequences of the brain and surrounding structures were obtained without intravenous contrast. COMPARISON:  Head CT from earlier today FINDINGS: Brain: Best seen on coronal diffusion acquisition there is a 6 mm area of restricted diffusion in the right para median midbrain. As permitted by fast protocol technique the remainder of the brain shows no evident demyelination or remote ischemia. Patient does have history of hypertension, diabetes, and hyperlipidemia. Brain volume is normal and there is no masslike finding, hydrocephalus, or collection. Vascular: Grossly preserved major flow voids. Skull and upper cervical spine: Sclerotic signal in the left paramedian  clivus that is benign-appearing and stable from a 2010 cervical MRI. Sinuses/Orbits: Negative IMPRESSION: Subcentimeter focus of restricted diffusion in the right midbrain consistent with an acute lacunar infarct. No prior ischemic injury or demyelination is seen in the remainder of the brain. Electronically Signed   By: Monte Fantasia M.D.   On: 09/16/2018 04:51   US Carotid Bilateral (at [facility] Only)  Result Date: 09/16/2018 CLINICAL DATA:  Lacunar infarction EXAM: BILATERAL CAROTID DUPLEX ULTRASOUND TECHNIQUE: Pearline Cables scale imaging, color Doppler and duplex ultrasound were performed of bilateral carotid and vertebral arteries in the neck. COMPARISON:  None. FINDINGS: Criteria: Quantification of carotid stenosis is based on velocity parameters that correlate the residual internal carotid diameter with NASCET-based stenosis levels, using the diameter of the distal internal carotid lumen as the denominator for stenosis measurement. The following velocity measurements were obtained: RIGHT ICA: 50 cm/sec CCA: 73 cm/sec SYSTOLIC ICA/CCA RATIO:  0.7 ECA: 66 cm/sec LEFT ICA: 53 cm/sec CCA: 64 cm/sec SYSTOLIC ICA/CCA RATIO:  0.8 ECA: 43 cm/sec RIGHT CAROTID ARTERY: Mild intimal thickening in the upper common carotid and bulb. Low resistance internal carotid Doppler pattern is preserved. RIGHT VERTEBRAL ARTERY:  Antegrade. LEFT CAROTID ARTERY: Mild intimal thickening in the upper common carotid artery and bulb. Low resistance internal carotid Doppler pattern is preserved. LEFT VERTEBRAL ARTERY:  Antegrade. IMPRESSION: Less than 50% stenosis in the right and left internal carotid arteries. Electronically Signed   By: Marybelle Killings M.D.   On: 09/16/2018 08:39    Management plans discussed with the patient, family and they are in agreement.  CODE STATUS:     Code Status Orders  (From admission, onward)         Start     Ordered   09/16/18 0632  Full code  Continuous     09/16/18 0631        Code Status  History    This patient has a current code status but no historical code status.      TOTAL TIME TAKING CARE OF THIS PATIENT: 35 minutes.    Loletha Grayer M.D on 09/16/2018 at 2:00 PM  Between 7am to 6pm - Pager - [phone]  After 6pm go to [email or web address] - password [email or web address] [facility]  [facility] Office  [phone]  CC: Primary care physician; Patient, No Pcp Per

## 2018-09-16 NOTE — ED Notes (Signed)
Pt to MRI

## 2018-09-16 NOTE — ED Notes (Signed)
MD MADE AWARE THAT PT REFUSES IV AND FLUIDS ORDERED. PT AGREES TO MRI.

## 2018-09-16 NOTE — ED Notes (Signed)
Pt states left sided facial numbness since yesterday morning. Pt states he left side of tongue feels numb. No drooling noted. Pt has clear speech. Pt does not have trouble ambulating.

## 2018-09-17 LAB — HIV ANTIBODY (ROUTINE TESTING W REFLEX): HIV SCREEN 4TH GENERATION: NONREACTIVE

## 2018-10-11 ENCOUNTER — Ambulatory Visit: Admit: 2018-10-11 | Payer: Self-pay | Admitting: Gastroenterology

## 2018-10-11 SURGERY — COLONOSCOPY WITH PROPOFOL
Anesthesia: General

## 2019-09-10 ENCOUNTER — Emergency Department: Payer: Managed Care, Other (non HMO)

## 2019-09-10 ENCOUNTER — Emergency Department
Admission: EM | Admit: 2019-09-10 | Discharge: 2019-09-10 | Disposition: A | Discharge status: Home / Self Care | Payer: Managed Care, Other (non HMO) | Attending: Student in an Organized Health Care Education/Training Program | Admitting: Student in an Organized Health Care Education/Training Program

## 2019-09-10 ENCOUNTER — Other Ambulatory Visit: Payer: Self-pay

## 2019-09-10 DIAGNOSIS — R42 Dizziness and giddiness: Secondary | ICD-10-CM | POA: Insufficient documentation

## 2019-09-10 DIAGNOSIS — Z79899 Other long term (current) drug therapy: Secondary | ICD-10-CM | POA: Insufficient documentation

## 2019-09-10 DIAGNOSIS — I10 Essential (primary) hypertension: Secondary | ICD-10-CM | POA: Diagnosis not present

## 2019-09-10 DIAGNOSIS — Z7982 Long term (current) use of aspirin: Secondary | ICD-10-CM | POA: Insufficient documentation

## 2019-09-10 DIAGNOSIS — E119 Type 2 diabetes mellitus without complications: Secondary | ICD-10-CM | POA: Diagnosis not present

## 2019-09-10 DIAGNOSIS — R0981 Nasal congestion: Secondary | ICD-10-CM | POA: Diagnosis not present

## 2019-09-10 DIAGNOSIS — R110 Nausea: Secondary | ICD-10-CM | POA: Diagnosis not present

## 2019-09-10 DIAGNOSIS — R197 Diarrhea, unspecified: Secondary | ICD-10-CM | POA: Insufficient documentation

## 2019-09-10 DIAGNOSIS — Z794 Long term (current) use of insulin: Secondary | ICD-10-CM | POA: Insufficient documentation

## 2019-09-10 DIAGNOSIS — R519 Headache, unspecified: Secondary | ICD-10-CM | POA: Insufficient documentation

## 2019-09-10 LAB — URINALYSIS, COMPLETE (UACMP) WITH MICROSCOPIC
Bilirubin Urine: NEGATIVE
Glucose, UA: NEGATIVE mg/dL
Ketones, ur: NEGATIVE mg/dL
Nitrite: NEGATIVE
Protein, ur: NEGATIVE mg/dL
Specific Gravity, Urine: 1.004 — ABNORMAL LOW (ref 1.005–1.030)
Squamous Epithelial / HPF: NONE SEEN (ref 0–5)
pH: 7 (ref 5.0–8.0)

## 2019-09-10 LAB — COMPREHENSIVE METABOLIC PANEL
ALT: 19 U/L (ref 0–44)
AST: 16 U/L (ref 15–41)
Albumin: 3.7 g/dL (ref 3.5–5.0)
Alkaline Phosphatase: 54 U/L (ref 38–126)
Anion gap: 10 (ref 5–15)
BUN: 6 mg/dL (ref 6–20)
CO2: 30 mmol/L (ref 22–32)
Calcium: 9.2 mg/dL (ref 8.9–10.3)
Chloride: 100 mmol/L (ref 98–111)
Creatinine, Ser: 0.99 mg/dL (ref 0.44–1.00)
GFR calc Af Amer: >60 mL/min (ref >60)
GFR calc non Af Amer: >60 mL/min (ref >60)
Glucose, Bld: 292 mg/dL — ABNORMAL HIGH (ref 70–99)
Potassium: 4.1 mmol/L (ref 3.5–5.1)
Sodium: 140 mmol/L (ref 135–145)
Total Bilirubin: 1.1 mg/dL (ref 0.3–1.2)
Total Protein: 7.4 g/dL (ref 6.5–8.1)

## 2019-09-10 LAB — CBC
HCT: 42.5 % (ref 36.0–46.0)
Hemoglobin: 13.2 g/dL (ref 12.0–15.0)
MCH: 25.1 pg — ABNORMAL LOW (ref 26.0–34.0)
MCHC: 31.1 g/dL (ref 30.0–36.0)
MCV: 81 fL (ref 80.0–100.0)
Platelets: 215 10*3/uL (ref 150–400)
RBC: 5.25 MIL/uL — ABNORMAL HIGH (ref 3.87–5.11)
RDW: 15.5 % (ref 11.5–15.5)
WBC: 4.8 10*3/uL (ref 4.0–10.5)
nRBC: 0 % (ref 0.0–0.2)

## 2019-09-10 LAB — DIFFERENTIAL
Abs Immature Granulocytes: 0.01 10*3/uL (ref 0.00–0.07)
Basophils Absolute: 0 10*3/uL (ref 0.0–0.1)
Basophils Relative: 1 %
Eosinophils Absolute: 0.1 10*3/uL (ref 0.0–0.5)
Eosinophils Relative: 2 %
Immature Granulocytes: 0 %
Lymphocytes Relative: 29 %
Lymphs Abs: 1.4 10*3/uL (ref 0.7–4.0)
Monocytes Absolute: 0.3 10*3/uL (ref 0.1–1.0)
Monocytes Relative: 7 %
Neutro Abs: 3 10*3/uL (ref 1.7–7.7)
Neutrophils Relative %: 61 %

## 2019-09-10 MED ORDER — PROCHLORPERAZINE MALEATE 10 MG PO TABS: 10.0000 mg | ORAL_TABLET | Freq: Four times a day (QID) | ORAL | 0 refills | Status: DC | PRN

## 2019-09-10 MED ORDER — PROCHLORPERAZINE EDISYLATE 10 MG/2ML IJ SOLN
10.0000 mg | Freq: Once | INTRAMUSCULAR | Status: AC
  Administered 2019-09-10: 10 mg via INTRAVENOUS
  Filled 2019-09-10: qty 2

## 2019-09-10 MED ORDER — LIDOCAINE HCL (PF) 1 % IJ SOLN
INTRAMUSCULAR | Status: AC
  Administered 2019-09-10: 5 mL via INTRADERMAL
  Filled 2019-09-10: qty 5

## 2019-09-10 MED ORDER — SODIUM CHLORIDE 0.9 % IV BOLUS
500.0000 mL | Freq: Once | INTRAVENOUS | Status: AC
  Administered 2019-09-10: 500 mL via INTRAVENOUS

## 2019-09-10 MED ORDER — LIDOCAINE HCL (PF) 1 % IJ SOLN: 5.0000 mL | Freq: Once | INTRAMUSCULAR | Status: AC

## 2019-09-10 MED ORDER — DIPHENHYDRAMINE HCL 50 MG/ML IJ SOLN
12.5000 mg | Freq: Once | INTRAMUSCULAR | Status: AC
  Administered 2019-09-10: 12.5 mg via INTRAVENOUS
  Filled 2019-09-10: qty 1

## 2019-09-10 NOTE — ED Triage Notes (Signed)
Pt reports intermittent headache and dizziness and constant nausea for the last week or more. Pt reports has been seen for it but no better.

## 2019-09-10 NOTE — ED Provider Notes (Signed)
[facility] Emergency Department Provider Note    First MD Initiated Contact with Patient 09/10/19 1454     (approximate)  I have reviewed the triage vital signs and the nursing notes.   HISTORY  Chief Complaint Dizziness, Headache, and Nausea    HPI Rhonda Mccall is a 53 y.o. female the below listed past medical history presents the ER for nausea lightheadedness congestion URI symptoms and headache.  Patient has been tested for Covid twice with which were negative.  Not having measured fevers.  Sent to ER because she felt like she was dehydrated.  Does not have any focal abdominal pain.  Has had some loose watery stools.  No recent antibiotic use.  States that she is not having improvement on oral Zofran.  Denies any chest pain.  No back pain.  No flank pain.  No dysuria.  No numbness or tingling.    Past Medical History:  Diagnosis Date  . Diabetes mellitus without complication ([place])   . Hyperlipidemia   . Hypertension    Family History  Problem Relation Age of Onset  . Diabetes Mother   . Hypertension Mother   . Stroke Mother    Past Surgical History:  Procedure Laterality Date  . COLONOSCOPY WITH ESOPHAGOGASTRODUODENOSCOPY (EGD)     Patient Active Problem List   Diagnosis Date Noted  . 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 Authorizing Provider  amLODipine (NORVASC) 5 MG tablet Take 5 mg by mouth daily.    [provider]  aspirin EC 81 MG EC tablet Take 1 tablet (81 mg total) by mouth daily. 09/17/18   Loletha Grayer, MD  Dulaglutide 1.5 MG/0.5ML SOPN Inject 1.5 mg into the skin once a week. 04/05/18   [provider]  Insulin Degludec 200 UNIT/ML SOPN Inject 30 Units into the skin daily. 11/29/17   [provider]  losartan (COZAAR) 100 MG tablet Take 100 mg by mouth daily.    [provider]  metFORMIN (GLUCOPHAGE) 500 MG  tablet Take 1,000 mg by mouth 2 (two) times daily with a meal.     [provider]  prochlorperazine (COMPAZINE) 10 MG tablet Take 1 tablet (10 mg total) by mouth every 6 (six) hours as needed for nausea or vomiting. 09/10/19   Merlyn Lot, MD  rosuvastatin (CRESTOR) 20 MG tablet Take 1 tablet (20 mg total) by mouth daily. 09/16/18   Loletha Grayer, MD    Allergies Sunscreens and Victoza [liraglutide]    Social History Social History   Tobacco Use  . Smoking status: Never Smoker  . Smokeless tobacco: Never Used  Substance Use Topics  . Alcohol use: No  . Drug use: Never    Review of Systems Patient denies headaches, rhinorrhea, blurry vision, numbness, shortness of breath, chest pain, edema, cough, abdominal pain, nausea, vomiting, diarrhea, dysuria, fevers, rashes or hallucinations unless otherwise stated above in HPI. ____________________________________________   PHYSICAL EXAM:  VITAL SIGNS: Vitals:   09/10/19 1125  BP: (!) 169/91  Pulse: 85  Resp: 20  Temp: 98.8 F (37.1 C)  SpO2: 95%    Constitutional: Alert and oriented.  Eyes: Conjunctivae are normal.  Head: Atraumatic. Nose: No congestion/rhinnorhea. Mouth/Throat: Mucous membranes are moist.   Neck: No stridor. Painless ROM.  Cardiovascular: Normal rate, regular rhythm. Grossly normal heart sounds.  Good peripheral circulation. Respiratory: Normal respiratory effort.  No retractions. Lungs CTAB. Gastrointestinal:  Soft and nontender. No distention. No abdominal bruits. No CVA tenderness. Genitourinary:  Musculoskeletal: No lower extremity tenderness nor edema.  No joint effusions. Neurologic:  Normal speech and language. No gross focal neurologic deficits are appreciated. No facial droop Skin:  Skin is warm, dry and intact. No rash noted. Psychiatric: Mood and affect are normal. Speech and behavior are normal.  ____________________________________________   LABS (all labs ordered are  listed, but only abnormal results are displayed)  Results for orders placed or performed during the hospital encounter of 09/10/19 (from the past 24 hour(s))  CBC     Status: Abnormal   Collection Time: 09/10/19 11:30 AM  Result Value Ref Range   WBC 4.8 4.0 - 10.5 K/uL   RBC 5.25 (H) 3.87 - 5.11 MIL/uL   Hemoglobin 13.2 12.0 - 15.0 g/dL   HCT 42.5 36.0 - 46.0 %   MCV 81.0 80.0 - 100.0 fL   MCH 25.1 (L) 26.0 - 34.0 pg   MCHC 31.1 30.0 - 36.0 g/dL   RDW 15.5 11.5 - 15.5 %   Platelets 215 150 - 400 K/uL   nRBC 0.0 0.0 - 0.2 %  Differential     Status: None   Collection Time: 09/10/19 11:30 AM  Result Value Ref Range   Neutrophils Relative % 61 %   Neutro Abs 3.0 1.7 - 7.7 K/uL   Lymphocytes Relative 29 %   Lymphs Abs 1.4 0.7 - 4.0 K/uL   Monocytes Relative 7 %   Monocytes Absolute 0.3 0.1 - 1.0 K/uL   Eosinophils Relative 2 %   Eosinophils Absolute 0.1 0.0 - 0.5 K/uL   Basophils Relative 1 %   Basophils Absolute 0.0 0.0 - 0.1 K/uL   Immature Granulocytes 0 %   Abs Immature Granulocytes 0.01 0.00 - 0.07 K/uL  Comprehensive metabolic panel     Status: Abnormal   Collection Time: 09/10/19 11:30 AM  Result Value Ref Range   Sodium 140 135 - 145 mmol/L   Potassium 4.1 3.5 - 5.1 mmol/L   Chloride 100 98 - 111 mmol/L   CO2 30 22 - 32 mmol/L   Glucose, Bld 292 (H) 70 - 99 mg/dL   BUN 6 6 - 20 mg/dL   Creatinine, Ser 0.99 0.44 - 1.00 mg/dL   Calcium 9.2 8.9 - 10.3 mg/dL   Total Protein 7.4 6.5 - 8.1 g/dL   Albumin 3.7 3.5 - 5.0 g/dL   AST 16 15 - 41 U/L   ALT 19 0 - 44 U/L   Alkaline Phosphatase 54 38 - 126 U/L   Total Bilirubin 1.1 0.3 - 1.2 mg/dL   GFR calc non Af Amer >60 >60 mL/min   GFR calc Af Amer >60 >60 mL/min   Anion gap 10 5 - 15  Urinalysis, Complete w Microscopic     Status: Abnormal   Collection Time: 09/10/19  4:58 PM  Result Value Ref Range   Color, Urine STRAW (A) YELLOW   APPearance CLEAR (A) CLEAR   Specific Gravity, Urine 1.004 (L) 1.005 - 1.030   pH  7.0 5.0 - 8.0   Glucose, UA NEGATIVE NEGATIVE mg/dL   Hgb urine dipstick MODERATE (A) NEGATIVE   Bilirubin Urine NEGATIVE NEGATIVE   Ketones, ur NEGATIVE NEGATIVE mg/dL   Protein, ur NEGATIVE NEGATIVE mg/dL   Nitrite NEGATIVE NEGATIVE   Leukocytes,Ua TRACE (A) NEGATIVE   RBC / HPF 0-5 0 - 5 RBC/hpf   WBC, UA 0-5 0 - 5 WBC/hpf  Bacteria, UA RARE (A) NONE SEEN   Squamous Epithelial / LPF NONE SEEN 0 - 5   Mucus PRESENT    ____________________________________________  EKG My review and personal interpretation at Time: 11:28   Indication: nausea  Rate: 80  Rhythm: sinus Axis: normal Other: normal intervals, no stemi ____________________________________________  RADIOLOGY  I personally reviewed all radiographic images ordered to evaluate for the above acute complaints and reviewed radiology reports and findings.  These findings were personally discussed with the patient.  Please see medical record for radiology report.  ____________________________________________   PROCEDURES  Procedure(s) performed:  Procedures Due to difficulty with obtaining IV access, a 20G peripheral IV catheter was inserted using US guidance into the lue.  The site was prepped with chlorhexidine and allowed to dry.  The patient tolerated the procedure without any complications.    Critical Care performed: no ____________________________________________   INITIAL IMPRESSION / ASSESSMENT AND PLAN / ED COURSE  Pertinent labs & imaging results that were available during my care of the patient were reviewed by me and considered in my medical decision making (see chart for details).   DDX: dehydration, gastritis, electrolyte abn, pancreatitis, enteritis, pna, covid, uti  Rhonda Mccall is a 53 y.o. who presents to the ED with symptoms as described above.  Patient well-appearing in no acute distress.  Blood work sent from triage is reassuring.  She is not febrile.  Reassuring neuro exam.  Review of records  since that she has had multiple presentations for similar symptoms in the past.  This is not a new or sudden onset headache.  Not consistent with meningitis.  Not consistent with subarachnoid.  Possible tension or migraine.  Do not feel th neuroimaging clinically indicated  Clinical Course as of Sep 10 1743  Wed Sep 10, 2019  1739 Patient reassessed feels very well at this time.  Headache resolved.  Repeat exam is reassuring.  Do not feel that further diagnostic testing clinically indicated at this time.  I do believe she is appropriate for outpatient follow-up.   [PR]    Clinical Course User Index [PR] Merlyn Lot, MD    The patient was evaluated in Emergency Department today for the symptoms described in the history of present illness. He/she was evaluated in the context of the global COVID-19 pandemic, which necessitated consideration that the patient might be at risk for infection with the SARS-CoV-2 virus that causes COVID-19. Institutional protocols and algorithms that pertain to the evaluation of patients at risk for COVID-19 are in a state of rapid change based on information released by regulatory bodies including the CDC and federal and state organizations. These policies and algorithms were followed during the patient's care in the ED.  As part of my medical decision making, I reviewed the following data within the [place] notes reviewed and incorporated, Labs reviewed, notes from prior ED visits and [place] Controlled Substance Database   ____________________________________________   FINAL CLINICAL IMPRESSION(S) / ED DIAGNOSES  Final diagnoses:  Nausea  Nonintractable headache, unspecified chronicity pattern, unspecified headache type      NEW MEDICATIONS STARTED DURING THIS VISIT:  New Prescriptions   PROCHLORPERAZINE (COMPAZINE) 10 MG TABLET    Take 1 tablet (10 mg total) by mouth every 6 (six) hours as needed for nausea or vomiting.      Note:  This document was prepared using Dragon voice recognition software and may include unintentional dictation errors.    Merlyn Lot, MD 09/10/19 [phone]

## 2019-09-15 ENCOUNTER — Other Ambulatory Visit: Payer: Self-pay | Admitting: Gastroenterology

## 2019-09-15 DIAGNOSIS — Z8673 Personal history of transient ischemic attack (TIA), and cerebral infarction without residual deficits: Secondary | ICD-10-CM

## 2019-09-15 DIAGNOSIS — R1032 Left lower quadrant pain: Secondary | ICD-10-CM

## 2019-09-15 DIAGNOSIS — R1031 Right lower quadrant pain: Secondary | ICD-10-CM

## 2019-09-15 DIAGNOSIS — R110 Nausea: Secondary | ICD-10-CM

## 2019-09-15 DIAGNOSIS — G8929 Other chronic pain: Secondary | ICD-10-CM

## 2019-09-18 ENCOUNTER — Other Ambulatory Visit: Payer: Self-pay | Admitting: Gastroenterology

## 2019-09-18 DIAGNOSIS — R1031 Right lower quadrant pain: Secondary | ICD-10-CM

## 2019-09-19 ENCOUNTER — Other Ambulatory Visit: Payer: Self-pay | Admitting: Gastroenterology

## 2019-09-19 DIAGNOSIS — N939 Abnormal uterine and vaginal bleeding, unspecified: Secondary | ICD-10-CM

## 2019-09-19 DIAGNOSIS — R102 Pelvic and perineal pain: Secondary | ICD-10-CM

## 2019-09-24 ENCOUNTER — Other Ambulatory Visit: Payer: Self-pay

## 2019-09-24 ENCOUNTER — Ambulatory Visit
Admission: RE | Admit: 2019-09-24 | Discharge: 2019-09-24 | Disposition: A | Discharge status: Home / Self Care | Payer: Managed Care, Other (non HMO) | Source: Ambulatory Visit | Attending: Gastroenterology | Admitting: Gastroenterology

## 2019-09-24 DIAGNOSIS — N939 Abnormal uterine and vaginal bleeding, unspecified: Secondary | ICD-10-CM | POA: Insufficient documentation

## 2019-09-24 DIAGNOSIS — R102 Pelvic and perineal pain: Secondary | ICD-10-CM | POA: Insufficient documentation

## 2019-09-25 ENCOUNTER — Ambulatory Visit: Payer: Managed Care, Other (non HMO)

## 2019-10-13 ENCOUNTER — Other Ambulatory Visit: Payer: Self-pay

## 2019-10-13 ENCOUNTER — Ambulatory Visit
Admission: RE | Admit: 2019-10-13 | Discharge: 2019-10-13 | Disposition: A | Discharge status: Home / Self Care | Payer: Managed Care, Other (non HMO) | Source: Ambulatory Visit | Attending: Gastroenterology | Admitting: Gastroenterology

## 2019-10-13 DIAGNOSIS — Z8673 Personal history of transient ischemic attack (TIA), and cerebral infarction without residual deficits: Secondary | ICD-10-CM | POA: Diagnosis present

## 2019-10-13 DIAGNOSIS — G8929 Other chronic pain: Secondary | ICD-10-CM | POA: Diagnosis present

## 2019-10-13 DIAGNOSIS — R110 Nausea: Secondary | ICD-10-CM | POA: Insufficient documentation

## 2019-10-13 DIAGNOSIS — R519 Headache, unspecified: Secondary | ICD-10-CM | POA: Diagnosis not present

## 2020-05-14 ENCOUNTER — Other Ambulatory Visit: Payer: Self-pay

## 2020-05-14 ENCOUNTER — Other Ambulatory Visit: Payer: Self-pay | Admitting: Family Medicine

## 2020-05-14 ENCOUNTER — Ambulatory Visit
Admission: RE | Admit: 2020-05-14 | Discharge: 2020-05-14 | Disposition: A | Discharge status: Home / Self Care | Payer: Managed Care, Other (non HMO) | Source: Ambulatory Visit | Attending: Family Medicine | Admitting: Family Medicine

## 2020-05-14 DIAGNOSIS — M7989 Other specified soft tissue disorders: Secondary | ICD-10-CM

## 2020-05-14 DIAGNOSIS — M79661 Pain in right lower leg: Secondary | ICD-10-CM | POA: Insufficient documentation

## 2020-07-30 ENCOUNTER — Encounter (INDEPENDENT_AMBULATORY_CARE_PROVIDER_SITE_OTHER): Payer: Self-pay | Admitting: Vascular Surgery

## 2021-05-02 ENCOUNTER — Other Ambulatory Visit: Payer: Self-pay | Admitting: Obstetrics and Gynecology

## 2021-05-02 ENCOUNTER — Other Ambulatory Visit: Payer: Self-pay

## 2021-05-02 ENCOUNTER — Ambulatory Visit
Admission: RE | Admit: 2021-05-02 | Discharge: 2021-05-02 | Disposition: A | Discharge status: Home / Self Care | Payer: Managed Care, Other (non HMO) | Source: Ambulatory Visit | Attending: Obstetrics and Gynecology | Admitting: Obstetrics and Gynecology

## 2021-05-02 ENCOUNTER — Ambulatory Visit: Payer: Managed Care, Other (non HMO) | Admitting: Anesthesiology

## 2021-05-02 ENCOUNTER — Encounter: Payer: Self-pay | Admitting: Obstetrics and Gynecology

## 2021-05-02 ENCOUNTER — Encounter
Admission: RE | Disposition: A | Discharge status: Home / Self Care | Payer: Self-pay | Source: Ambulatory Visit | Attending: Obstetrics and Gynecology

## 2021-05-02 DIAGNOSIS — D250 Submucous leiomyoma of uterus: Secondary | ICD-10-CM | POA: Diagnosis not present

## 2021-05-02 DIAGNOSIS — E7800 Pure hypercholesterolemia, unspecified: Secondary | ICD-10-CM | POA: Diagnosis not present

## 2021-05-02 DIAGNOSIS — Z7982 Long term (current) use of aspirin: Secondary | ICD-10-CM | POA: Insufficient documentation

## 2021-05-02 DIAGNOSIS — Z803 Family history of malignant neoplasm of breast: Secondary | ICD-10-CM | POA: Insufficient documentation

## 2021-05-02 DIAGNOSIS — E669 Obesity, unspecified: Secondary | ICD-10-CM | POA: Insufficient documentation

## 2021-05-02 DIAGNOSIS — E119 Type 2 diabetes mellitus without complications: Secondary | ICD-10-CM | POA: Diagnosis not present

## 2021-05-02 DIAGNOSIS — Z833 Family history of diabetes mellitus: Secondary | ICD-10-CM | POA: Diagnosis not present

## 2021-05-02 DIAGNOSIS — Z794 Long term (current) use of insulin: Secondary | ICD-10-CM | POA: Insufficient documentation

## 2021-05-02 DIAGNOSIS — I10 Essential (primary) hypertension: Secondary | ICD-10-CM | POA: Diagnosis not present

## 2021-05-02 DIAGNOSIS — Z888 Allergy status to other drugs, medicaments and biological substances status: Secondary | ICD-10-CM | POA: Diagnosis not present

## 2021-05-02 DIAGNOSIS — Z8249 Family history of ischemic heart disease and other diseases of the circulatory system: Secondary | ICD-10-CM | POA: Diagnosis not present

## 2021-05-02 DIAGNOSIS — Z79899 Other long term (current) drug therapy: Secondary | ICD-10-CM | POA: Insufficient documentation

## 2021-05-02 DIAGNOSIS — Z8673 Personal history of transient ischemic attack (TIA), and cerebral infarction without residual deficits: Secondary | ICD-10-CM | POA: Insufficient documentation

## 2021-05-02 DIAGNOSIS — N920 Excessive and frequent menstruation with regular cycle: Secondary | ICD-10-CM | POA: Insufficient documentation

## 2021-05-02 DIAGNOSIS — Z6841 Body Mass Index (BMI) 40.0 and over, adult: Secondary | ICD-10-CM | POA: Diagnosis not present

## 2021-05-02 HISTORY — PX: HYSTEROSCOPY WITH D & C: SHX1775

## 2021-05-02 LAB — TYPE AND SCREEN
ABO/RH(D): A POS
Antibody Screen: NEGATIVE

## 2021-05-02 LAB — POCT PREGNANCY, URINE: Preg Test, Ur: NEGATIVE

## 2021-05-02 LAB — GLUCOSE, CAPILLARY
Glucose-Capillary: 302 mg/dL — ABNORMAL HIGH (ref 70–99)
Glucose-Capillary: 243 mg/dL — ABNORMAL HIGH (ref 70–99)

## 2021-05-02 LAB — ABO/RH: ABO/RH(D): A POS

## 2021-05-02 SURGERY — DILATATION AND CURETTAGE /HYSTEROSCOPY
Anesthesia: General | Site: Vagina

## 2021-05-02 MED ORDER — 0.9 % SODIUM CHLORIDE (POUR BTL) OPTIME
TOPICAL | Status: DC | PRN
  Administered 2021-05-02: 100 mL

## 2021-05-02 MED ORDER — DEXAMETHASONE SODIUM PHOSPHATE 10 MG/ML IJ SOLN
INTRAMUSCULAR | Status: DC | PRN
  Administered 2021-05-02: 10 mg via INTRAVENOUS

## 2021-05-02 MED ORDER — CHLORHEXIDINE GLUCONATE 0.12 % MT SOLN
OROMUCOSAL | Status: AC
  Administered 2021-05-02: 15 mL via OROMUCOSAL
  Filled 2021-05-02: qty 15

## 2021-05-02 MED ORDER — ONDANSETRON HCL 4 MG/2ML IJ SOLN
INTRAMUSCULAR | Status: AC
  Filled 2021-05-02: qty 2

## 2021-05-02 MED ORDER — DEXTROSE 5 % IV SOLN
INTRAVENOUS | Status: DC | PRN
  Administered 2021-05-02: 3 g via INTRAVENOUS

## 2021-05-02 MED ORDER — ONDANSETRON HCL 4 MG/2ML IJ SOLN
INTRAMUSCULAR | Status: DC | PRN
  Administered 2021-05-02: 4 mg via INTRAVENOUS

## 2021-05-02 MED ORDER — IPRATROPIUM-ALBUTEROL 0.5-2.5 (3) MG/3ML IN SOLN
RESPIRATORY_TRACT | Status: AC
  Filled 2021-05-02: qty 3

## 2021-05-02 MED ORDER — MIDAZOLAM HCL 2 MG/2ML IJ SOLN
INTRAMUSCULAR | Status: AC
  Filled 2021-05-02: qty 2

## 2021-05-02 MED ORDER — SUGAMMADEX SODIUM 500 MG/5ML IV SOLN
INTRAVENOUS | Status: AC
  Filled 2021-05-02: qty 5

## 2021-05-02 MED ORDER — PROPOFOL 10 MG/ML IV BOLUS
INTRAVENOUS | Status: AC
  Filled 2021-05-02: qty 20

## 2021-05-02 MED ORDER — ROCURONIUM BROMIDE 10 MG/ML (PF) SYRINGE
PREFILLED_SYRINGE | INTRAVENOUS | Status: AC
  Filled 2021-05-02: qty 10

## 2021-05-02 MED ORDER — PROPOFOL 10 MG/ML IV BOLUS
INTRAVENOUS | Status: DC | PRN
  Administered 2021-05-02: 200 mg via INTRAVENOUS
  Administered 2021-05-02 (×2): 100 mg via INTRAVENOUS

## 2021-05-02 MED ORDER — ORAL CARE MOUTH RINSE: 15.0000 mL | Freq: Once | OROMUCOSAL | Status: AC

## 2021-05-02 MED ORDER — LIDOCAINE HCL (CARDIAC) PF 100 MG/5ML IV SOSY
PREFILLED_SYRINGE | INTRAVENOUS | Status: DC | PRN
  Administered 2021-05-02: 100 mg via INTRAVENOUS

## 2021-05-02 MED ORDER — OXYCODONE HCL 5 MG PO TABS: 5.0000 mg | ORAL_TABLET | ORAL | 0 refills | Status: DC | PRN

## 2021-05-02 MED ORDER — IPRATROPIUM-ALBUTEROL 0.5-2.5 (3) MG/3ML IN SOLN
3.0000 mL | Freq: Once | RESPIRATORY_TRACT | Status: AC
  Administered 2021-05-02: 3 mL via RESPIRATORY_TRACT

## 2021-05-02 MED ORDER — SILVER NITRATE-POT NITRATE 75-25 % EX MISC
CUTANEOUS | Status: AC
  Filled 2021-05-02: qty 10

## 2021-05-02 MED ORDER — ROCURONIUM BROMIDE 100 MG/10ML IV SOLN
INTRAVENOUS | Status: DC | PRN
  Administered 2021-05-02: 20 mg via INTRAVENOUS

## 2021-05-02 MED ORDER — PROMETHAZINE HCL 25 MG/ML IJ SOLN: 6.2500 mg | INTRAMUSCULAR | Status: DC | PRN

## 2021-05-02 MED ORDER — SUGAMMADEX SODIUM 500 MG/5ML IV SOLN
INTRAVENOUS | Status: DC | PRN
  Administered 2021-05-02: 300 mg via INTRAVENOUS

## 2021-05-02 MED ORDER — FENTANYL CITRATE (PF) 100 MCG/2ML IJ SOLN: 25.0000 ug | INTRAMUSCULAR | Status: DC | PRN

## 2021-05-02 MED ORDER — GLYCOPYRROLATE 0.2 MG/ML IJ SOLN
INTRAMUSCULAR | Status: DC | PRN
  Administered 2021-05-02: .2 mg via INTRAVENOUS

## 2021-05-02 MED ORDER — PHENYLEPHRINE HCL (PRESSORS) 10 MG/ML IV SOLN
INTRAVENOUS | Status: DC | PRN
  Administered 2021-05-02 (×2): 100 ug via INTRAVENOUS

## 2021-05-02 MED ORDER — LACTATED RINGERS IV SOLN: INTRAVENOUS | Status: DC

## 2021-05-02 MED ORDER — SUCCINYLCHOLINE CHLORIDE 200 MG/10ML IV SOSY
PREFILLED_SYRINGE | INTRAVENOUS | Status: DC | PRN
  Administered 2021-05-02: 120 mg via INTRAVENOUS

## 2021-05-02 MED ORDER — SODIUM CHLORIDE 0.9 % IV SOLN: INTRAVENOUS | Status: DC

## 2021-05-02 MED ORDER — FENTANYL CITRATE (PF) 100 MCG/2ML IJ SOLN
INTRAMUSCULAR | Status: DC | PRN
  Administered 2021-05-02 (×2): 50 ug via INTRAVENOUS

## 2021-05-02 MED ORDER — POVIDONE-IODINE 10 % EX SWAB
2.0000 | Freq: Once | CUTANEOUS | Status: DC
Start: 2021-05-02 — End: 2021-05-03

## 2021-05-02 MED ORDER — MIDAZOLAM HCL 2 MG/2ML IJ SOLN
INTRAMUSCULAR | Status: DC | PRN
  Administered 2021-05-02: 2 mg via INTRAVENOUS

## 2021-05-02 MED ORDER — SUCCINYLCHOLINE CHLORIDE 200 MG/10ML IV SOSY
PREFILLED_SYRINGE | INTRAVENOUS | Status: AC
  Filled 2021-05-02: qty 10

## 2021-05-02 MED ORDER — CHLORHEXIDINE GLUCONATE 0.12 % MT SOLN: 15.0000 mL | Freq: Once | OROMUCOSAL | Status: AC

## 2021-05-02 MED ORDER — TRANEXAMIC ACID-NACL 1000-0.7 MG/100ML-% IV SOLN
INTRAVENOUS | Status: AC
  Filled 2021-05-02: qty 100

## 2021-05-02 MED ORDER — FENTANYL CITRATE (PF) 100 MCG/2ML IJ SOLN
INTRAMUSCULAR | Status: AC
  Filled 2021-05-02: qty 2

## 2021-05-02 MED ORDER — TRANEXAMIC ACID-NACL 1000-0.7 MG/100ML-% IV SOLN
INTRAVENOUS | Status: DC | PRN
  Administered 2021-05-02: 1000 mg via INTRAVENOUS

## 2021-05-02 MED ORDER — CEFAZOLIN SODIUM 1 G IJ SOLR
INTRAMUSCULAR | Status: AC
  Filled 2021-05-02: qty 30

## 2021-05-02 MED ORDER — TRANEXAMIC ACID 650 MG PO TABS: 1300.0000 mg | ORAL_TABLET | Freq: Three times a day (TID) | ORAL | 2 refills | Status: DC

## 2021-05-02 SURGICAL SUPPLY — 20 items
BAG INFUSER PRESSURE 100CC (MISCELLANEOUS) IMPLANT
CATH ROBINSON RED A/P 16FR (CATHETERS) ×2 IMPLANT
ELECT REM PT RETURN 9FT ADLT (ELECTROSURGICAL) ×2
ELECTRODE REM PT RTRN 9FT ADLT (ELECTROSURGICAL) ×1 IMPLANT
GAUZE 4X4 16PLY ~~LOC~~+RFID DBL (SPONGE) ×4 IMPLANT
GLOVE SURG ENC MOIS LTX SZ7 (GLOVE) ×2 IMPLANT
GLOVE SURG UNDER LTX SZ7.5 (GLOVE) ×2 IMPLANT
GOWN STRL REUS W/ TWL LRG LVL3 (GOWN DISPOSABLE) ×2 IMPLANT
GOWN STRL REUS W/TWL LRG LVL3 (GOWN DISPOSABLE) ×2
IV NS IRRIG 3000ML ARTHROMATIC (IV SOLUTION) ×2 IMPLANT
KIT PROCEDURE FLUENT (KITS) ×2 IMPLANT
KIT TURNOVER CYSTO (KITS) ×2 IMPLANT
MANIFOLD NEPTUNE II (INSTRUMENTS) ×2 IMPLANT
PACK DNC HYST (MISCELLANEOUS) ×2 IMPLANT
PAD OB MATERNITY 4.3X12.25 (PERSONAL CARE ITEMS) ×2 IMPLANT
PAD PREP 24X41 OB/GYN DISP (PERSONAL CARE ITEMS) ×2 IMPLANT
SCRUB EXIDINE 4% CHG 4OZ (MISCELLANEOUS) ×2 IMPLANT
SEAL ROD LENS SCOPE MYOSURE (ABLATOR) IMPLANT
TUBING CONNECTING 10 (TUBING) ×2 IMPLANT
WATER STERILE IRR 500ML POUR (IV SOLUTION) ×2 IMPLANT

## 2021-05-02 NOTE — Progress Notes (Signed)
Patients daughter called back to PACU at 2315 after patient was discharged approx. 2.5 hrs prior in the evening. Daughter states that patient has complaints of sore throat and RN stated this is usual side effect from breathing tube from surgery. Daughter concerned about oxygen saturations while patient is sleeping. As low as 88%. RN states this is most likely related to the anesthesia still in her system.. RN stated sleeping sitting up would assist with this and if she felt uncomfortable with her oxygen levels to bring her in the to ED or to call the surgeon on call. She stated that when patient awake and alert oxygen levels improve into the 90s. Patient able to walk without being dizzy, is not complaining of bleeding or uncontrollable pain. Daughter stated that her mother does have a history of Sleep Apnea and unknown if she usually uses a CPAP or not. Daughter also stated that it was reported to her that on her arrival home (daughter had just arrived) that the patient "passed out in the front yard after clearing her throat". Stated she was able to get back to her feet with assistance. RN clarified that patient did not hit her head and did not have any bleeding. Daughter denies both. Pt currently mentating well and alert and oriented. She is resting sitting up high in the bed. RN stated to daughter since it had been about 2 hours since patient fell and that pt is believed to be stable in regards to the fall and is able to walk and carry conversation that its sounds reasonable to monitor her as patient is against coming back to hospital. Advised daughter to call surgeon on call to review the above for further advice and to head to the ED if patient begins to get dizzy or confused or if she had concerns about her mothers health. Daughter in agreement with the above advice.

## 2021-05-02 NOTE — Anesthesia Procedure Notes (Signed)
Procedure Name: Intubation Date/Time: 05/02/2021 6:31 PM Performed by: Lendon Colonel, CRNA Pre-anesthesia Checklist: Patient identified, Emergency Drugs available, Suction available and Patient being monitored Patient Re-evaluated:Patient Re-evaluated prior to induction Oxygen Delivery Method: Circle system utilized Preoxygenation: Pre-oxygenation with 100% oxygen Induction Type: IV induction Ventilation: Mask ventilation without difficulty Laryngoscope Size: McGraph and 3 Tube type: Oral Tube size: 7.0 mm Number of attempts: 1 Airway Equipment and Method: Stylet and Oral airway Placement Confirmation: ETT inserted through vocal cords under direct vision, positive ETCO2 and breath sounds checked- equal and bilateral Secured at: 21 cm Tube secured with: Tape Dental Injury: Teeth and Oropharynx as per pre-operative assessment

## 2021-05-02 NOTE — Discharge Instructions (Addendum)
Discharge instructions after a hysteroscopy with dilation and curettage  I am sending in a prescription for tranexamic acid, which is a medication to help any type of bleeding slow down - it is used on the battlefield for this reason. Once you slow down, I will recommend a progesterone, Provera '5mg'$  orally twice or three times a day to try to control bleeding.  I found fibroids inside the uterine cavity itself, as well as the ones we saw in your uterine muscle. I am going to recommend a hysterectomy, and will recommend you go see the minimally-invasive gynecologic surgeons. I'll talk with you about this tomorrow.  Signs and Symptoms to Report  Call our office at [phone] if you have any of the following:    Fever over 100.4 degrees or higher  Severe stomach pain not relieved with pain medications  Bright red bleeding that's heavier than a period that does not slow with rest after the first 24 hours  To go the bathroom a lot (frequency), you can't hold your urine (urgency), or it hurts when you empty your bladder (urinate)  Chest pain  Shortness of breath  Pain in the calves of your legs  Severe nausea and vomiting not relieved with anti-nausea medications  Any concerns  What You Can Expect after Surgery  You may see some pink tinged, bloody fluid. This is normal. You may also have cramping for several days.   Activities after Your Discharge Follow these guidelines to help speed your recovery at home:  Don't drive if you are in pain or taking narcotic pain medicine. You may drive when you can safely slam on the brakes, turn the wheel forcefully, and rotate your torso comfortably. This is typically 4-7 days. Practice in a parking lot or side street prior to attempting to drive regularly.   Ask others to help with household chores for 4 weeks.  Don't do strenuous activities, exercises, or sports like vacuuming, tennis, squash, etc. until your doctor says it is safe to do so.  Walk as  you feel able. Rest often since it may take a week or two for your energy level to return to normal.   You may climb stairs  Avoid constipation:   -Eat fruits, vegetables, and whole grains. Eat small meals as your appetite will take time to return to normal.   -Drink 6 to 8 glasses of water each day unless your doctor has told you to limit your fluids.   -Use a laxative or stool softener as needed if constipation becomes a problem. You may take Miralax, metamucil, Citrucil, Colace, Senekot, FiberCon, etc. If this does not relieve the constipation, try two tablespoons of Milk Of Magnesia every 8 hours until your bowels move.   You may shower.   Do not get in a hot tub, swimming pool, etc. until your doctor agrees.  Do not douche, use tampons, or have sex until your doctor says it is okay, usually about 2 weeks.  Take your pain medicine when you need it. The medicine may not work as well if the pain is bad.  Take the medicines you were taking before surgery. Other medications you might need are pain medications (ibuprofen), medications for constipation (Colace) and nausea medications (Zofran).    AMBULATORY SURGERY  DISCHARGE INSTRUCTIONS  The drugs that you were given will stay in your system until tomorrow so for the next 24 hours you should not: Drive an automobile Make any legal decisions Drink any alcoholic beverage  You may resume regular meals tomorrow.  Today it is better to start with liquids and gradually work up to solid foods. You may eat anything you prefer, but it is better to start with liquids, then soup and crackers, and gradually work up to solid foods.  Please notify your doctor immediately if you have any unusual bleeding, trouble breathing, redness and pain at the surgery site, drainage, fever, or pain not relieved by medication.  Additional Instructions:  Please contact your physician with any problems or Same Day Surgery at [phone], Monday through Friday 6 am to  4 pm, or [place] at [facility] number at [phone].

## 2021-05-02 NOTE — Op Note (Signed)
Operative Report Hysteroscopy with Dilation and Curettage   Indications: Menorrhagia   Pre-operative Diagnosis: Heavy menstrual bleeding   Post-operative Diagnosis: same.  Procedure: 1. Exam under anesthesia 2. Fractional D&C 3. Hysteroscopy  Surgeon: Benjaman Kindler, MD  Assistant(s):  None  Anesthesia: General LMA anesthesia  Anesthesiologist: Martha Clan, MD Anesthesiologist: Martha Clan, MD CRNA: Lendon Colonel, CRNA; Lerry Liner, CRNA  Estimated Blood Loss:  less than 50 mL         Intraoperative medications:  tranexamic acid         Total IV Fluids: 858m  Urine Output: 566m Total Fluid Deficit:  40 mL          Specimens: Endocervical curettings, endometrial curettings         Complications:  None; patient tolerated the procedure well.         Disposition: PACU - hemodynamically stable.         Condition: stable  Findings: Uterus measuring 11 cm by sound; normal cervix, vagina, perineum. Submucosal fibroids noted, at least 3 of them, but her bleeding was so heavy that even with the Fluent machine I could not get a good visualization enough to perform a hysteroscopic myomectomy. Both tubal ostia visualized. No perforation noted. No evidence of endometrial cancer noted.  Indication for procedure/Consents: [age].o. F here for scheduled surgery for the aforementioned diagnoses.  Risks of surgery were discussed with the patient including but not limited to: bleeding which may require transfusion; infection which may require antibiotics; injury to uterus or surrounding organs; intrauterine scarring which may impair future fertility; need for additional procedures including laparotomy or laparoscopy; and other postoperative/anesthesia complications. Written informed consent was obtained.    Procedure Details:  Fractional D&C only  The patient was taken to the operating room where anesthesia was administered and was found to be adequate.  After a formal  and adequate timeout was performed, she was placed in the dorsal lithotomy position and examined with the above findings. She was then prepped and draped in the sterile manner.   Her bladder was catheterized for an estimated amount of clear, yellow urine. A weighed speculum was then placed in the patient's vagina and a single tooth tenaculum was applied to the anterior lip of the cervix.  Her cervix was serially dilated to 15 FrPakistansing Hanks dilators.  An ECC was performed. The hysteroscope was introduced to reveal the above findings. Because of her narrow introitus multiple dilation attempts were carefully made, and enough bleeding and adjusting that I gave 3g Ancef mid-procedure.  A sharp curettage was then performed, moving over the fibroids and feeling all the irregularities of the endometrium. Her cavity was obscured by the heavy bleeding, and 1g TXA was given over 10 minutes.  The tenaculum was removed from the anterior lip of the cervix and the vaginal speculum was removed.  The patient tolerated the procedure well and was taken to the recovery area awake and in stable condition. She received iv acetaminophen and Toradol prior to leaving the OR.  The patient will be discharged to home as per PACU criteria. Routine postoperative instructions given.  She was prescribed Ibuprofen and Colace, Lysteda and I will plan for provera '5mg'$  BID to TID until bleeding stops.  She will follow up in the clinic in two weeks for postoperative evaluation. However, I am going to recommend hysterectomy with MIGS as soon as possible.

## 2021-05-02 NOTE — H&P (Signed)
Chief Complaint:    Rhonda Mccall is a 55 y.o. female presenting with Vaginal Bleeding (Heavy bleeding started Saturday AM with clots, LMP 8/17)     History of Present Illness: Pt presents for evaluation of heavy menstrual bleeding. She woke up Saturday covered in blood and has been bleeding through pads ever since. It has not lightened or stopped since Wednesday. She has a long history of heavy bleeding, but she says it has never been this bad.    TVUS today Uterus 19 x 14 x 9.6 cm  EE 16 mm  Fibroid uterus -rt fundal 9.3 cm  -post 5.6 cm  -fundal 6 cm  Neither ovaries seen    Pertinent hx: -Hx of AUB since menarche, hx of uterine fibroids; I saw her 09/2019 for [place] f/u; [place], LH, E2 showed perimenopause & not conclusive   -[facility]US 08/2019 at [facility]: Ut: 11.6 x 6.2 x 4.7 cm = volume: 177 mL. Multiple uterine nodules are identified consistent with multiple leiomyomata. Largest include a posterior RIGHT fundal 7.2 x 6.9 x 5.9 cm subserosal leiomyoma, 6.3 x 3.9 x 5.5 cm fundal leiomyoma, and a 6.2 x 4.5 x 7.5 cm posterior leiomyoma. Several of the leiomyomata appear to extend submucosal. Endometrium Thickness: 10 mm.  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  Past Surgical History:  has a past surgical history that includes NEGATIVE ULTRASOUND-GUIDED NEEDLE CORE BIOPSY (05/03/2011) and egd. Family History: family history includes Alcohol abuse in her father; Breast cancer in her maternal aunt; Cancer in her father and mother; Diabetes in her mother; Diabetes type II in her maternal uncle and mother; Gallbladder disease in her mother; High blood pressure (Hypertension) in her mother; Leukemia in her paternal aunt; Liver cancer in her father; No Known Problems in her brother, brother, maternal grandfather, maternal grandmother, paternal grandfather, and paternal grandmother; Stroke in her mother. Social History:  reports that she has never smoked. She has never used smokeless tobacco. She reports that she does not drink alcohol and does not use drugs. OB/GYN History:          OB History     Gravida  4   Para      Term      Preterm      AB  4   Living         SAB  1   IAB  3   Ectopic      Molar      Multiple      Live Births              Allergies: is allergic to invokana [canagliflozin], sunscreen, and victoza [liraglutide]. Medications: Current Outpatient Medications:    amLODIPine (NORVASC) 5 MG tablet, Take 2 tablets (10 mg total) by mouth once daily For Further Refills Patient Will Need To Be Seen In Our Office Or Send To PCP (Patient taking  differently: Take 10 mg by mouth once daily), Disp: 180 tablet, Rfl: 1   aspirin 81 MG EC tablet, Take 81 mg by mouth once daily., Disp: , Rfl:    blood-glucose meter Misc, 1 each by XX route as directed, Disp: 1 kit, Rfl: 0   cholecalciferol (VITAMIN D3) 2,000 unit tablet, Take 2,000 Units by mouth once daily, Disp: , Rfl:    dapagliflozin (FARXIGA) 10 mg tablet, Take 1 tablet (10 mg total) by mouth every morning, Disp: 90 tablet, Rfl: 3   dulaglutide 4.5 mg/0.5 mL PnIj, Inject 0.5 mLs (4.5 mg total) subcutaneously every 7 (seven) days, Disp: 6 mL, Rfl: 3    ezetimibe (ZETIA) 10 mg tablet, Take 1 tablet (10 mg total) by mouth once daily, Disp: 90 tablet, Rfl: 3   glimepiride (AMARYL) 4 MG tablet, Take 1 tablet by mouth once daily with breakfast, Disp: 90 tablet, Rfl: 4   insulin DEGLUDEC (TRESIBA FLEXTOUCH U-200) pen injector (concentration 200 units/mL), Inject 64 Units subcutaneously once daily, Disp: 27 mL, Rfl: 2   losartan (COZAAR) 100 MG tablet, Take 1 tablet (100 mg total) by mouth once daily, Disp: 90 tablet, Rfl: 1   rosuvastatin (CRESTOR) 40 MG tablet, Take 1 tablet (40 mg total) by mouth once daily, Disp: 90 tablet, Rfl: 3   albuterol (PROVENTIL) 2.5 mg /3 mL (0.083 %) nebulizer solution, Take 3 mLs (2.5 mg total) by nebulization every 6 (six) hours as needed for Wheezing (Patient not taking: No sig reported), Disp: 75 mL, Rfl: 1   albuterol 90 mcg/actuation inhaler, Inhale 2 inhalations into the lungs every 4 (four) hours as needed (Patient not taking: No sig reported), Disp: 6.7 g, Rfl: 0   blood glucose diagnostic (GLUCOSE BLOOD) test strip, Use 1 each (1 strip total) 4 (four) times daily Use as instructed., Disp: 100 each, Rfl: 12   budesonide (PULMICORT) 1 mg/2 mL nebulizer solution, Take 2 mLs (1 mg total) by nebulization once daily (Patient not taking: Reported on 05/02/2021), Disp: 60 mL, Rfl: 1   montelukast (SINGULAIR) 10 mg tablet, Take 1 tablet (10 mg total) by mouth nightly, Disp: 90 tablet, Rfl: 1   Review of Systems: No SOB, no palpitations or chest pain, no new lower extremity edema, no nausea or vomiting or bowel or bladder complaints. See HPI for gyn specific ROS.    Exam:    BP 139/84   Pulse 106   Ht 167.6 cm ('5\' 6"' )   Wt (!) 142.7 kg (314 lb 9.6 oz)   LMP 04/27/2021   BMI 50.78 kg/m    General: Patient is well-groomed, well-nourished, appears stated age in no acute distress   HEENT: head is atraumatic and normocephalic, trachea is midline, neck is supple with no palpable nodules   CV: Regular rhythm and normal  heart rate, no murmur   Pulm: Clear to auscultation throughout lung fields with no wheezing, crackles, or rhonchi. No increased work of breathing   Abdomen: soft , no mass, non-tender, no rebound tenderness, no hepatomegaly   Pelvic: deferred    Impression:    The primary encounter diagnosis was Excessive or frequent menstruation. Diagnoses of Perimenopausal menorrhagia, Abnormal uterine bleeding (AUB), Uterine leiomyoma, unspecified location, and Preop examination were also pertinent to this visit.   Plan:    1. Excessive menstrual bleeding, Fibroid uterus, Enlarged uterus  -TVUS reviewed with patient today. Her uterus has grown in size from 11 cm in 09/2019 to 19 cm today. Discussed that I would like to  get an MRI of her pelvis to further evaluate as soon as possible.  -We discussed various causes of perimenopausal AUB and various methods of bleeding control: Lysteda, pill, patch, ring, injection, implant, IUD -Because of her heavy and concerning bleeding acutely, discussed option for D&C with hysteroscopy, with possible hysterectomy for her enlarged and growing fibroids.  -Will start with D&C, MRI in future, Lysteda if bleeding resumes heavily postoperatively   She has requested D&C with hysteroscopy for management and evaluation of her AUB and menorrhagia. We discussed option for IUD insertion at this time, she declines   -Risks of surgery were discussed with the patient including but not limited to: bleeding which may require transfusion; infection which may require antibiotics; injury to uterus or surrounding organs; intrauterine scarring which may impair future fertility; need for additional procedures including laparotomy or laparoscopy; and other postoperative/anesthesia complications. Written informed consent was obtained.   This is a scheduled same-day surgery. She will have a postop visit in 2 weeks to review operative findings and pathology.     Diagnoses and all orders for  this visit:   Excessive or frequent menstruation   Perimenopausal menorrhagia   Abnormal uterine bleeding (AUB)

## 2021-05-02 NOTE — Anesthesia Preprocedure Evaluation (Signed)
Anesthesia Evaluation  Patient identified by MRN, date of birth, ID band Patient awake    Reviewed: Allergy & Precautions, H&P , NPO status , Patient's Chart, lab work & pertinent test results, reviewed documented beta blocker date and time   History of Anesthesia Complications Negative for: history of anesthetic complications  Airway Mallampati: II  TM Distance: >3 FB Neck ROM: full    Dental  (+) Dental Advidsory Given, Teeth Intact, Missing, Chipped   Pulmonary neg shortness of breath, asthma , sleep apnea , neg recent URI,    Pulmonary exam normal breath sounds clear to auscultation       Cardiovascular Exercise Tolerance: Good hypertension, (-) angina(-) Past MI and (-) Cardiac Stents Normal cardiovascular exam(-) dysrhythmias (-) Valvular Problems/Murmurs Rhythm:regular Rate:Normal     Neuro/Psych neg Seizures CVA, No Residual Symptoms negative psych ROS   GI/Hepatic Neg liver ROS, GERD  ,  Endo/Other  diabetes, Poorly ControlledMorbid obesity  Renal/GU negative Renal ROS  negative genitourinary   Musculoskeletal   Abdominal   Peds  Hematology negative hematology ROS (+)   Anesthesia Other Findings Past Medical History: No date: Diabetes mellitus without complication (HCC) No date: Hyperlipidemia No date: Hypertension   Reproductive/Obstetrics negative OB ROS                             Anesthesia Physical Anesthesia Plan  ASA: 3  Anesthesia Plan: General   Post-op Pain Management:    Induction: Intravenous  PONV Risk Score and Plan: 3 and Ondansetron, Dexamethasone, Midazolam and Treatment may vary due to age or medical condition  Airway Management Planned: LMA  Additional Equipment:   Intra-op Plan:   Post-operative Plan: Extubation in OR  Informed Consent: I have reviewed the patients History and Physical, chart, labs and discussed the procedure including the  risks, benefits and alternatives for the proposed anesthesia with the patient or authorized representative who has indicated his/her understanding and acceptance.     Dental Advisory Given  Plan Discussed with: Anesthesiologist, CRNA and Surgeon  Anesthesia Plan Comments:         Anesthesia Quick Evaluation

## 2021-05-02 NOTE — Transfer of Care (Signed)
Immediate Anesthesia Transfer of Care Note  Patient: Rhonda Mccall  Procedure(s) Performed: DILATATION AND CURETTAGE /HYSTEROSCOPY (Vagina )  Patient Location: PACU  Anesthesia Type:General  Level of Consciousness: oriented and patient cooperative  Airway & Oxygen Therapy: Patient Spontanous Breathing and Patient connected to face mask oxygen  Post-op Assessment: Report given to RN and Post -op Vital signs reviewed and stable  Post vital signs: Reviewed and stable  Last Vitals:  Vitals Value Taken Time  BP 132/63 05/02/21 1945  Temp 36.2 C 05/02/21 1939  Pulse 94 05/02/21 1948  Resp 18 05/02/21 1948  SpO2 100 % 05/02/21 1948  Vitals shown include unvalidated device data.  Last Pain:  Vitals:   05/02/21 1939  TempSrc:   PainSc: 0-No pain         Complications: No notable events documented.

## 2021-05-03 ENCOUNTER — Encounter: Payer: Self-pay | Admitting: Obstetrics and Gynecology

## 2021-05-03 NOTE — Anesthesia Postprocedure Evaluation (Signed)
Anesthesia Post Note  Patient: Rhonda Mccall  Procedure(s) Performed: DILATATION AND CURETTAGE /HYSTEROSCOPY (Vagina )  Patient location during evaluation: PACU Anesthesia Type: General Level of consciousness: awake and alert Pain management: pain level controlled Vital Signs Assessment: post-procedure vital signs reviewed and stable Respiratory status: spontaneous breathing, nonlabored ventilation, respiratory function stable and patient connected to nasal cannula oxygen Cardiovascular status: blood pressure returned to baseline and stable Postop Assessment: no apparent nausea or vomiting Anesthetic complications: no   No notable events documented.   Last Vitals:  Vitals:   05/02/21 2023 05/02/21 2037  BP: (!) 146/72 (!) 157/81  Pulse: 99   Resp: (!) 21 20  Temp:    SpO2: 92% 94%    Last Pain:  Vitals:   05/02/21 2037  TempSrc:   PainSc: 0-No pain                 Martha Clan

## 2021-05-04 LAB — SURGICAL PATHOLOGY

## 2021-05-05 ENCOUNTER — Other Ambulatory Visit: Payer: Self-pay

## 2021-05-05 ENCOUNTER — Encounter: Payer: Self-pay | Admitting: *Deleted

## 2021-05-05 ENCOUNTER — Inpatient Hospital Stay
Admission: EM | Admit: 2021-05-05 | Discharge: 2021-05-06 | DRG: 760 | Disposition: A | Discharge status: Home / Self Care | Payer: Managed Care, Other (non HMO)

## 2021-05-05 DIAGNOSIS — Z7984 Long term (current) use of oral hypoglycemic drugs: Secondary | ICD-10-CM

## 2021-05-05 DIAGNOSIS — E861 Hypovolemia: Secondary | ICD-10-CM | POA: Diagnosis present

## 2021-05-05 DIAGNOSIS — D259 Leiomyoma of uterus, unspecified: Secondary | ICD-10-CM | POA: Diagnosis present

## 2021-05-05 DIAGNOSIS — D62 Acute posthemorrhagic anemia: Secondary | ICD-10-CM | POA: Diagnosis present

## 2021-05-05 DIAGNOSIS — E785 Hyperlipidemia, unspecified: Secondary | ICD-10-CM | POA: Diagnosis present

## 2021-05-05 DIAGNOSIS — I10 Essential (primary) hypertension: Secondary | ICD-10-CM | POA: Diagnosis present

## 2021-05-05 DIAGNOSIS — N920 Excessive and frequent menstruation with regular cycle: Secondary | ICD-10-CM | POA: Diagnosis not present

## 2021-05-05 DIAGNOSIS — N939 Abnormal uterine and vaginal bleeding, unspecified: Secondary | ICD-10-CM

## 2021-05-05 DIAGNOSIS — E1165 Type 2 diabetes mellitus with hyperglycemia: Secondary | ICD-10-CM | POA: Diagnosis present

## 2021-05-05 DIAGNOSIS — Z20822 Contact with and (suspected) exposure to covid-19: Secondary | ICD-10-CM | POA: Diagnosis present

## 2021-05-05 DIAGNOSIS — E878 Other disorders of electrolyte and fluid balance, not elsewhere classified: Secondary | ICD-10-CM | POA: Diagnosis present

## 2021-05-05 DIAGNOSIS — R739 Hyperglycemia, unspecified: Secondary | ICD-10-CM

## 2021-05-05 LAB — BASIC METABOLIC PANEL
Anion gap: 9 (ref 5–15)
BUN: 18 mg/dL (ref 6–20)
CO2: 28 mmol/L (ref 22–32)
Calcium: 8.9 mg/dL (ref 8.9–10.3)
Chloride: 96 mmol/L — ABNORMAL LOW (ref 98–111)
Creatinine, Ser: 1.02 mg/dL — ABNORMAL HIGH (ref 0.44–1.00)
GFR, Estimated: >60 mL/min (ref >60)
Glucose, Bld: 644 mg/dL (ref 70–99)
Potassium: 4.8 mmol/L (ref 3.5–5.1)
Sodium: 133 mmol/L — ABNORMAL LOW (ref 135–145)

## 2021-05-05 LAB — URINALYSIS, COMPLETE (UACMP) WITH MICROSCOPIC
Bacteria, UA: NONE SEEN
Bilirubin Urine: NEGATIVE
Glucose, UA: >=500 mg/dL — AB
Ketones, ur: NEGATIVE mg/dL
Nitrite: NEGATIVE
Protein, ur: 30 mg/dL — AB
RBC / HPF: >50 RBC/hpf — ABNORMAL HIGH (ref 0–5)
Specific Gravity, Urine: 1.028 (ref 1.005–1.030)
Squamous Epithelial / HPF: NONE SEEN (ref 0–5)
WBC, UA: >50 WBC/hpf — ABNORMAL HIGH (ref 0–5)
pH: 7 (ref 5.0–8.0)

## 2021-05-05 LAB — CBC
HCT: 24.7 % — ABNORMAL LOW (ref 36.0–46.0)
Hemoglobin: 7.8 g/dL — ABNORMAL LOW (ref 12.0–15.0)
MCH: 26.3 pg (ref 26.0–34.0)
MCHC: 31.6 g/dL (ref 30.0–36.0)
MCV: 83.2 fL (ref 80.0–100.0)
Platelets: 253 10*3/uL (ref 150–400)
RBC: 2.97 MIL/uL — ABNORMAL LOW (ref 3.87–5.11)
RDW: 17.3 % — ABNORMAL HIGH (ref 11.5–15.5)
WBC: 8.7 10*3/uL (ref 4.0–10.5)
nRBC: 0.2 % (ref 0.0–0.2)

## 2021-05-05 LAB — CBG MONITORING, ED: Glucose-Capillary: >600 mg/dL (ref 70–99)

## 2021-05-05 MED ORDER — TRANEXAMIC ACID-NACL 1000-0.7 MG/100ML-% IV SOLN
1000.0000 mg | INTRAVENOUS | Status: AC
  Administered 2021-05-06: 1000 mg via INTRAVENOUS
  Filled 2021-05-05: qty 100

## 2021-05-05 MED ORDER — LACTATED RINGERS IV BOLUS
2000.0000 mL | Freq: Once | INTRAVENOUS | Status: AC
Start: 2021-05-05 — End: 2021-05-06
  Administered 2021-05-06: 2000 mL via INTRAVENOUS

## 2021-05-05 NOTE — ED Triage Notes (Addendum)
Pt says her sugar today was 594. Has only been taking insulin injections, not her oral medications. Recently started on prednisone (had recent [facility], was intubated and on prednisone for uvula swelling.

## 2021-05-06 ENCOUNTER — Telehealth: Payer: Self-pay

## 2021-05-06 DIAGNOSIS — D62 Acute posthemorrhagic anemia: Secondary | ICD-10-CM | POA: Diagnosis present

## 2021-05-06 DIAGNOSIS — N920 Excessive and frequent menstruation with regular cycle: Secondary | ICD-10-CM | POA: Diagnosis present

## 2021-05-06 DIAGNOSIS — D259 Leiomyoma of uterus, unspecified: Secondary | ICD-10-CM | POA: Insufficient documentation

## 2021-05-06 DIAGNOSIS — E669 Obesity, unspecified: Secondary | ICD-10-CM | POA: Insufficient documentation

## 2021-05-06 DIAGNOSIS — E1165 Type 2 diabetes mellitus with hyperglycemia: Secondary | ICD-10-CM | POA: Diagnosis present

## 2021-05-06 DIAGNOSIS — I152 Hypertension secondary to endocrine disorders: Secondary | ICD-10-CM | POA: Insufficient documentation

## 2021-05-06 DIAGNOSIS — I10 Essential (primary) hypertension: Secondary | ICD-10-CM

## 2021-05-06 DIAGNOSIS — E861 Hypovolemia: Secondary | ICD-10-CM | POA: Diagnosis present

## 2021-05-06 DIAGNOSIS — M542 Cervicalgia: Secondary | ICD-10-CM | POA: Insufficient documentation

## 2021-05-06 DIAGNOSIS — E1100 Type 2 diabetes mellitus with hyperosmolarity without nonketotic hyperglycemic-hyperosmolar coma (NKHHC): Secondary | ICD-10-CM | POA: Diagnosis not present

## 2021-05-06 DIAGNOSIS — E785 Hyperlipidemia, unspecified: Secondary | ICD-10-CM | POA: Diagnosis present

## 2021-05-06 DIAGNOSIS — N946 Dysmenorrhea, unspecified: Secondary | ICD-10-CM | POA: Insufficient documentation

## 2021-05-06 DIAGNOSIS — E871 Hypo-osmolality and hyponatremia: Secondary | ICD-10-CM | POA: Diagnosis not present

## 2021-05-06 DIAGNOSIS — N924 Excessive bleeding in the premenopausal period: Secondary | ICD-10-CM | POA: Insufficient documentation

## 2021-05-06 DIAGNOSIS — E878 Other disorders of electrolyte and fluid balance, not elsewhere classified: Secondary | ICD-10-CM | POA: Diagnosis present

## 2021-05-06 DIAGNOSIS — Z7984 Long term (current) use of oral hypoglycemic drugs: Secondary | ICD-10-CM | POA: Diagnosis not present

## 2021-05-06 DIAGNOSIS — E1159 Type 2 diabetes mellitus with other circulatory complications: Secondary | ICD-10-CM | POA: Insufficient documentation

## 2021-05-06 DIAGNOSIS — Z20822 Contact with and (suspected) exposure to covid-19: Secondary | ICD-10-CM | POA: Diagnosis present

## 2021-05-06 DIAGNOSIS — E1169 Type 2 diabetes mellitus with other specified complication: Secondary | ICD-10-CM | POA: Insufficient documentation

## 2021-05-06 LAB — CBC
HCT: 26.3 % — ABNORMAL LOW (ref 36.0–46.0)
Hemoglobin: 8.5 g/dL — ABNORMAL LOW (ref 12.0–15.0)
MCH: 26.9 pg (ref 26.0–34.0)
MCHC: 32.3 g/dL (ref 30.0–36.0)
MCV: 83.2 fL (ref 80.0–100.0)
Platelets: 143 10*3/uL — ABNORMAL LOW (ref 150–400)
RBC: 3.16 MIL/uL — ABNORMAL LOW (ref 3.87–5.11)
RDW: 17.2 % — ABNORMAL HIGH (ref 11.5–15.5)
WBC: 9.8 10*3/uL (ref 4.0–10.5)
nRBC: 0 % (ref 0.0–0.2)

## 2021-05-06 LAB — CBG MONITORING, ED
Glucose-Capillary: 134 mg/dL — ABNORMAL HIGH (ref 70–99)
Glucose-Capillary: 344 mg/dL — ABNORMAL HIGH (ref 70–99)

## 2021-05-06 LAB — GLUCOSE, CAPILLARY
Glucose-Capillary: 94 mg/dL (ref 70–99)
Glucose-Capillary: 87 mg/dL (ref 70–99)

## 2021-05-06 LAB — HEMOGLOBIN A1C
Hgb A1c MFr Bld: 8.5 % — ABNORMAL HIGH (ref 4.8–5.6)
Mean Plasma Glucose: 197.25 mg/dL

## 2021-05-06 LAB — RESP PANEL BY RT-PCR (FLU A&B, COVID) ARPGX2
Influenza A by PCR: NEGATIVE
Influenza B by PCR: NEGATIVE
SARS Coronavirus 2 by RT PCR: NEGATIVE

## 2021-05-06 MED ORDER — ROSUVASTATIN CALCIUM 20 MG PO TABS
20.0000 mg | ORAL_TABLET | Freq: Every day | ORAL | Status: DC
  Administered 2021-05-06: 20 mg via ORAL
  Filled 2021-05-06: qty 1

## 2021-05-06 MED ORDER — INSULIN GLARGINE-YFGN 100 UNIT/ML ~~LOC~~ SOLN
40.0000 [IU] | Freq: Every day | SUBCUTANEOUS | Status: DC
  Filled 2021-05-06 (×2): qty 0.4

## 2021-05-06 MED ORDER — DULAGLUTIDE 1.5 MG/0.5ML ~~LOC~~ SOAJ: 1.5000 mg | SUBCUTANEOUS | Status: DC

## 2021-05-06 MED ORDER — INSULIN ASPART 100 UNIT/ML IJ SOLN: 0.0000 [IU] | Freq: Three times a day (TID) | INTRAMUSCULAR | Status: DC

## 2021-05-06 MED ORDER — SODIUM CHLORIDE 0.9 % IV SOLN
10.0000 mL/h | Freq: Once | INTRAVENOUS | Status: AC
  Administered 2021-05-06: 10 mL/h via INTRAVENOUS

## 2021-05-06 MED ORDER — LOSARTAN POTASSIUM 50 MG PO TABS
100.0000 mg | ORAL_TABLET | Freq: Every day | ORAL | Status: DC
  Administered 2021-05-06: 100 mg via ORAL
  Filled 2021-05-06: qty 2

## 2021-05-06 MED ORDER — OXYCODONE HCL 5 MG PO TABS
5.0000 mg | ORAL_TABLET | ORAL | Status: DC | PRN
  Administered 2021-05-06: 5 mg via ORAL
  Filled 2021-05-06: qty 1

## 2021-05-06 MED ORDER — SODIUM CHLORIDE 0.9 % IV SOLN: 10.0000 mL/h | Freq: Once | INTRAVENOUS | Status: DC

## 2021-05-06 MED ORDER — IBUPROFEN 600 MG PO TABS: 600.0000 mg | ORAL_TABLET | Freq: Four times a day (QID) | ORAL | Status: DC | PRN

## 2021-05-06 MED ORDER — TRANEXAMIC ACID 650 MG PO TABS
1300.0000 mg | ORAL_TABLET | Freq: Three times a day (TID) | ORAL | Status: DC
Start: 2021-05-06 — End: 2021-05-06
  Administered 2021-05-06: 1300 mg via ORAL
  Filled 2021-05-06 (×2): qty 2

## 2021-05-06 MED ORDER — INSULIN ASPART 100 UNIT/ML IJ SOLN
0.0000 [IU] | INTRAMUSCULAR | Status: DC
Start: 2021-05-06 — End: 2021-05-06
  Administered 2021-05-06: 3 [IU] via SUBCUTANEOUS
  Filled 2021-05-06: qty 1

## 2021-05-06 MED ORDER — AMLODIPINE BESYLATE 5 MG PO TABS
5.0000 mg | ORAL_TABLET | Freq: Every day | ORAL | Status: DC
  Administered 2021-05-06: 5 mg via ORAL
  Filled 2021-05-06: qty 1

## 2021-05-06 MED ORDER — GLIMEPIRIDE 4 MG PO TABS
4.0000 mg | ORAL_TABLET | Freq: Every day | ORAL | Status: DC
  Administered 2021-05-06: 4 mg via ORAL
  Filled 2021-05-06: qty 1

## 2021-05-06 MED ORDER — DAPAGLIFLOZIN PROPANEDIOL 5 MG PO TABS
10.0000 mg | ORAL_TABLET | Freq: Every day | ORAL | Status: DC
  Administered 2021-05-06: 10 mg via ORAL
  Filled 2021-05-06 (×2): qty 2

## 2021-05-06 MED ORDER — LABETALOL HCL 5 MG/ML IV SOLN
20.0000 mg | INTRAVENOUS | Status: DC | PRN
  Administered 2021-05-06: 20 mg via INTRAVENOUS
  Filled 2021-05-06: qty 4

## 2021-05-06 MED ORDER — INSULIN GLARGINE-YFGN 100 UNIT/ML ~~LOC~~ SOLN
30.0000 [IU] | Freq: Every day | SUBCUTANEOUS | Status: DC
  Filled 2021-05-06 (×2): qty 0.3

## 2021-05-06 MED ORDER — ACETAMINOPHEN 325 MG PO TABS: 650.0000 mg | ORAL_TABLET | Freq: Four times a day (QID) | ORAL | Status: DC | PRN

## 2021-05-06 MED ORDER — SODIUM CHLORIDE 0.9 % IV SOLN
300.0000 mg | Freq: Once | INTRAVENOUS | Status: AC
  Administered 2021-05-06: 300 mg via INTRAVENOUS
  Filled 2021-05-06: qty 15

## 2021-05-06 NOTE — Progress Notes (Addendum)
Inpatient Diabetes Program Recommendations  AACE/ADA: New Consensus Statement on Inpatient Glycemic Control (2015)  Target Ranges:  Prepandial:   less than 140 mg/dL      Peak postprandial:   less than 180 mg/dL (1-2 hours)      Critically ill patients:  140 - 180 mg/dL  Results for RHELDA, HEITZMANN (MRN [id number]) as of 05/06/2021 08:57  Ref. Range 05/05/2021 21:11 05/06/2021 00:44 05/06/2021 08:23  Glucose-Capillary Latest Ref Range: 70 - 99 mg/dL >600 (HH)  2L LR IVF bolus given 0027 344 (H) 134 (H)     Admit from ED with Menorrhagia with subsequent acute blood loss anemia (underwent D&C 8/22)/Uncontrolled DM2 with nonketotic hyperglycemia (lab glucose 644 on admit)  History: DM2   Home DM Meds: Farxiga 10 mg daily   Amaryl 4 mg daily   Tresiba (u-200) 40 units daily   Trulicity 4.5 mg Qweek  Current Orders: Semglee 40 units daily Novolog 0-20 units Q4 hours Farxiga 10 mg daily Amaryl 4 mg daily Trulicity 4.5 mg Qweek(won't get Trulicity unless pt brought to hospital as hospital does not carry)     To start all Meds this morning.  Lab glucose 644 last PM (AG 9/ CO2 28)--Was given 2L LR and CBG down to 344 by MN.      ENDO: Dr. Cline Crock seen 12/06/2020--Was told to Increase the Tresiba to 54 units Daily and continue the other meds--A1c at that visit was 7.9%    MD- Note current NPO status this AM.  CBG down to 134 this AM.  No Insulin has been documented as being given yet.  Looks like IVF bolus helped reduce CBGs.  Given NPO status, please consider reducing the Semglee to 30 units Daily (75% home dose)  Sent Secure Chat to Dr. Priscella Mann about the above info  Note A1c pending--However, concern about accuracy given Hemoglobin level low on admit     --Will follow patient during hospitalization--  Wyn Quaker RN, MSN, CDE Diabetes Coordinator Inpatient Glycemic Control Team Team Pager: [phone] (8a-5p)

## 2021-05-06 NOTE — Discharge Summary (Signed)
Subjective: Bleeding improved after iv lysteda.  No SOB or CP. Resting quietly.  Last blood sugar 94, declines her long acting insulin for now Last pad check 10g of blood  Objective: Vital signs in last 24 hours: Temp:  [98 F (36.7 C)-98.4 F (36.9 C)] 98 F (36.7 C) (08/26 1223) Pulse Rate:  [69-101] 90 (08/26 1223) Resp:  [16-20] 18 (08/26 1223) BP: (136-164)/(76-124) 140/76 (08/26 1223) SpO2:  [97 %-100 %] 99 % (08/26 1223) Weight:  [142.4 kg] 142.4 kg (08/25 2107)  Intake/Output  Intake/Output Summary (Last 24 hours) at 05/06/2021 1531 Last data filed at 05/06/2021 1200 Gross per 24 hour  Intake 395.33 ml  Output 10 ml  Net 385.33 ml    Physical Exam:  General: Alert and oriented. GI: Soft, Nondistended. Fundus at umbilicus  Extremities: Nontender, no erythema, no edema.   Recent Results (from the past 2160 hour(s))  Type and screen [place]     Status: None   Collection Time: 05/02/21  2:59 PM  Result Value Ref Range   ABO/RH(D) A POS    Antibody Screen NEG    Sample Expiration      05/05/2021,2359 Performed at [facility], [address] [place], [place] [address]   Glucose, capillary     Status: Abnormal   Collection Time: 05/02/21  2:59 PM  Result Value Ref Range   Glucose-Capillary 302 (H) 70 - 99 mg/dL    Comment: Glucose reference range applies only to samples taken after fasting for at least 8 hours.  ABO/Rh     Status: None   Collection Time: 05/02/21  3:04 PM  Result Value Ref Range   ABO/RH(D)      A POS Performed at [facility], [address] [place], [place] [address]   Pregnancy, urine POC     Status: None   Collection Time: 05/02/21  3:08 PM  Result Value Ref Range   Preg Test, Ur NEGATIVE NEGATIVE    Comment:        THE SENSITIVITY OF THIS METHODOLOGY IS >24 mIU/mL   Surgical pathology     Status: None   Collection Time: 05/02/21  6:58 PM  Result Value Ref Range   SURGICAL  PATHOLOGY      SURGICAL PATHOLOGY CASE: [id number] PATIENT: Rhonda Mccall Surgical Pathology Report     Specimen Submitted: A. Endocervical curettings B. Endometrial curettings  Clinical History: Menorrhagia      DIAGNOSIS: A. ENDOCERVIX; CURETTAGE: - BENIGN SQUAMOUS AND ENDOCERVICAL EPITHELIUM. - NEGATIVE FOR DYSPLASIA AND MALIGNANCY.  B. ENDOMETRIUM; CURETTAGE: - SCANT BENIGN ENDOMETRIAL TISSUE, NEGATIVE FOR ATYPIA / EIN AND MALIGNANCY. - BENIGN SQUAMOUS AND ENDOCERVICAL MUCOSA, NEGATIVE FOR DYSPLASIA AND MALIGNANCY.   GROSS DESCRIPTION: A. Labeled: Endocervical curetting Received: Formalin Collection time: 6:58 PM on 05/02/2021 Placed into formalin time: 6:59 PM on 05/02/2021 Tissue fragment(s): Multiple Size: Aggregate, 1.5 x 0.7 x 0.1 cm Description: Received on a Telfa pad are fragments of tan-brown soft tissue. Entirely submitted in 1 cassette.  B. Labeled: Endometrial curetting Received: Formalin Collection time: 7:15 PM on 05/02/2021 Placed into fo rmalin time: 7:20 PM on 05/02/2021 Tissue fragment(s): Multiple Size: Aggregate, 2.4 x 2.2 x 0.5 cm Description: Received on 2 Telfa pads are fragments of red-brown soft tissue and blood clot. Entirely submitted in 1 cassette.  RB 05/03/2021  Final Diagnosis performed by Betsy Pries, MD.   Electronically signed 05/04/2021 11:15:02AM The electronic signature indicates that the named Attending Pathologist has evaluated the  specimen Technical component performed at [facility], [address] [place], [place] [address] Lab: [phone] Dir: Rush Farmer, MD, MMM  Professional component performed at [facility], [facility], [address], [place], [place] [address] Lab: [phone] Dir: Dellia Nims. Rubinas, MD   Glucose, capillary     Status: Abnormal   Collection Time: 05/02/21  7:42 PM  Result Value Ref Range   Glucose-Capillary 243 (H) 70 - 99 mg/dL    Comment: Glucose reference range  applies only to samples taken after fasting for at least 8 hours.  Basic metabolic panel     Status: Abnormal   Collection Time: 05/05/21  9:10 PM  Result Value Ref Range   Sodium 133 (L) 135 - 145 mmol/L   Potassium 4.8 3.5 - 5.1 mmol/L   Chloride 96 (L) 98 - 111 mmol/L   CO2 28 22 - 32 mmol/L   Glucose, Bld 644 (HH) 70 - 99 mg/dL    Comment: CRITICAL RESULT CALLED TO, READ BACK BY AND VERIFIED WITH LISA THOMPSON'@2158'$  05/05/21 RH Glucose reference range applies only to samples taken after fasting for at least 8 hours.    BUN 18 6 - 20 mg/dL   Creatinine, Ser 1.02 (H) 0.44 - 1.00 mg/dL   Calcium 8.9 8.9 - 10.3 mg/dL   GFR, Estimated >60 >60 mL/min    Comment: (NOTE) Calculated using the CKD-EPI Creatinine Equation (2021)    Anion gap 9 5 - 15    Comment: Performed at [facility], [address] [place], [place] [address]  CBC     Status: Abnormal   Collection Time: 05/05/21  9:10 PM  Result Value Ref Range   WBC 8.7 4.0 - 10.5 K/uL   RBC 2.97 (L) 3.87 - 5.11 MIL/uL   Hemoglobin 7.8 (L) 12.0 - 15.0 g/dL   HCT 24.7 (L) 36.0 - 46.0 %   MCV 83.2 80.0 - 100.0 fL   MCH 26.3 26.0 - 34.0 pg   MCHC 31.6 30.0 - 36.0 g/dL   RDW 17.3 (H) 11.5 - 15.5 %   Platelets 253 150 - 400 K/uL   nRBC 0.2 0.0 - 0.2 %    Comment: Performed at [facility], [address] [place], [place] [address]  Urinalysis, Complete w Microscopic Urine, Clean Catch     Status: Abnormal   Collection Time: 05/05/21  9:10 PM  Result Value Ref Range   Color, Urine AMBER (A) YELLOW    Comment: BIOCHEMICALS MAY BE AFFECTED BY COLOR   APPearance HAZY (A) CLEAR   Specific Gravity, Urine 1.028 1.005 - 1.030   pH 7.0 5.0 - 8.0   Glucose, UA >=500 (A) NEGATIVE mg/dL   Hgb urine dipstick MODERATE (A) NEGATIVE   Bilirubin Urine NEGATIVE NEGATIVE   Ketones, ur NEGATIVE NEGATIVE mg/dL   Protein, ur 30 (A) NEGATIVE mg/dL   Nitrite NEGATIVE NEGATIVE   Leukocytes,Ua TRACE (A) NEGATIVE   RBC / HPF >50  (H) 0 - 5 RBC/hpf   WBC, UA >50 (H) 0 - 5 WBC/hpf   Bacteria, UA NONE SEEN NONE SEEN   Squamous Epithelial / LPF NONE SEEN 0 - 5    Comment: Performed at [facility], [address] [place], [place] [address]  CBG monitoring, ED     Status: Abnormal   Collection Time: 05/05/21  9:11 PM  Result Value Ref Range   Glucose-Capillary >600 (HH) 70 - 99 mg/dL    Comment: Glucose reference range applies only to  samples taken after fasting for at least 8 hours.   Comment 1 Notify RN    Comment 2 Document in Chart   Resp Panel by RT-PCR (Flu A&B, Covid) Nasopharyngeal Swab     Status: None   Collection Time: 05/05/21 11:46 PM   Specimen: Nasopharyngeal Swab; Nasopharyngeal(NP) swabs in vial transport medium  Result Value Ref Range   SARS Coronavirus 2 by RT PCR NEGATIVE NEGATIVE    Comment: (NOTE) SARS-CoV-2 target nucleic acids are NOT DETECTED.  The SARS-CoV-2 RNA is generally detectable in upper respiratory specimens during the acute phase of infection. The lowest concentration of SARS-CoV-2 viral copies this assay can detect is 138 copies/mL. A negative result does not preclude SARS-Cov-2 infection and should not be used as the sole basis for treatment or other patient management decisions. A negative result may occur with  improper specimen collection/handling, submission of specimen other than nasopharyngeal swab, presence of viral mutation(s) within the areas targeted by this assay, and inadequate number of viral copies(<138 copies/mL). A negative result must be combined with clinical observations, patient history, and epidemiological information. The expected result is Negative.  Fact Sheet for Patients:  EntrepreneurPulse.com.au  Fact Sheet for Healthcare Providers:  IncredibleEmployment.be  This test is no t yet approved or cleared by the [place] FDA and  has been authorized for detection and/or diagnosis of SARS-CoV-2  by FDA under an Emergency Use Authorization (EUA). This EUA will remain  in effect (meaning this test can be used) for the duration of the COVID-19 declaration under Section 564(b)(1) of the Act, 21 U.S.C.section 360bbb-3(b)(1), unless the authorization is terminated  or revoked sooner.       Influenza A by PCR NEGATIVE NEGATIVE   Influenza B by PCR NEGATIVE NEGATIVE    Comment: (NOTE) The Xpert Xpress SARS-CoV-2/FLU/RSV plus assay is intended as an aid in the diagnosis of influenza from Nasopharyngeal swab specimens and should not be used as a sole basis for treatment. Nasal washings and aspirates are unacceptable for Xpert Xpress SARS-CoV-2/FLU/RSV testing.  Fact Sheet for Patients: EntrepreneurPulse.com.au  Fact Sheet for Healthcare Providers: IncredibleEmployment.be  This test is not yet approved or cleared by the [place] FDA and has been authorized for detection and/or diagnosis of SARS-CoV-2 by FDA under an Emergency Use Authorization (EUA). This EUA will remain in effect (meaning this test can be used) for the duration of the COVID-19 declaration under Section 564(b)(1) of the Act, 21 U.S.C. section 360bbb-3(b)(1), unless the authorization is terminated or revoked.  Performed at [facility], [address] [place], [place] [address]   Type and screen [place]     Status: None (Preliminary result)   Collection Time: 05/05/21 11:46 PM  Result Value Ref Range   ABO/RH(D) A POS    Antibody Screen NEG    Sample Expiration 05/08/2021,2359    Unit Number [id number]    Blood Component Type RED CELLS,LR    Unit division 00    Status of Unit ALLOCATED    Transfusion Status OK TO TRANSFUSE    Crossmatch Result Compatible    Unit Number [id number]    Blood Component Type RED CELLS,LR    Unit division 00    Status of Unit ISSUED    Transfusion Status OK TO TRANSFUSE    Crossmatch Result       Compatible Performed at [facility], [address], [place], [place] [address]   BPAM RBC     Status: None (Preliminary  result)   Collection Time: 05/05/21 11:46 PM  Result Value Ref Range   Blood Product Unit Number [id number]    PRODUCT CODE [id number]    Unit Type and Rh [id number]    Blood Product Expiration Date [id number]    ISSUE DATE / TIME [id number]    Blood Product Unit Number [id number]    PRODUCT CODE [id number]    Unit Type and Rh 6200    Blood Product Expiration Date [id number]   Prepare RBC (crossmatch)     Status: None   Collection Time: 05/06/21 12:24 AM  Result Value Ref Range   Order Confirmation      ORDER PROCESSED BY BLOOD BANK Performed at [facility], [address] [place], [place] [address]   CBG monitoring, ED     Status: Abnormal   Collection Time: 05/06/21 12:44 AM  Result Value Ref Range   Glucose-Capillary 344 (H) 70 - 99 mg/dL    Comment: Glucose reference range applies only to samples taken after fasting for at least 8 hours.   Comment 1 Notify RN    Comment 2 Document in Chart   Hemoglobin A1c     Status: Abnormal   Collection Time: 05/06/21  4:17 AM  Result Value Ref Range   Hgb A1c MFr Bld 8.5 (H) 4.8 - 5.6 %    Comment: (NOTE) Pre diabetes:          5.7%-6.4%  Diabetes:              >6.4%  Glycemic control for   <7.0% adults with diabetes    Mean Plasma Glucose 197.25 mg/dL    Comment: Performed at [facility] [address], [place], [place] [address]  CBG monitoring, ED     Status: Abnormal   Collection Time: 05/06/21  8:23 AM  Result Value Ref Range   Glucose-Capillary 134 (H) 70 - 99 mg/dL    Comment: Glucose reference range applies only to samples taken after fasting for at least 8 hours.  CBC     Status: Abnormal   Collection Time: 05/06/21  8:24 AM  Result Value Ref Range   WBC 9.8 4.0 - 10.5 K/uL   RBC 3.16 (L) 3.87 - 5.11 MIL/uL   Hemoglobin 8.5 (L) 12.0 - 15.0 g/dL   HCT 26.3  (L) 36.0 - 46.0 %   MCV 83.2 80.0 - 100.0 fL   MCH 26.9 26.0 - 34.0 pg   MCHC 32.3 30.0 - 36.0 g/dL   RDW 17.2 (H) 11.5 - 15.5 %   Platelets 143 (L) 150 - 400 K/uL   nRBC 0.0 0.0 - 0.2 %    Comment: Performed at [facility], [address] [place], [place] [address]  Glucose, capillary     Status: None   Collection Time: 05/06/21 12:19 PM  Result Value Ref Range   Glucose-Capillary 94 70 - 99 mg/dL    Comment: Glucose reference range applies only to samples taken after fasting for at least 8 hours.     Assessment/Plan:  Uncontrolled diabetes: Appreciate hospitalists involvment for this acute phase. Resolving. F/u as outpatient with Dr. Honor Junes  Acute iron deficiency anemia:  - s/p 1 u pRBCs - s/p '200mg'$  iron sucrose - plan for po iron at home  Fibroid uterus, menorrhagia: - plan for [facility] with gyn oncology. They will see her this week. Will move forward with [facility] if needed    LOS: 0 days   Benjaman Kindler 05/06/2021, 3:31  PM  Patient ID: Rhonda Mccall, female   DOB: 10/29/1965, 55 y.o.   MRN: [id number]

## 2021-05-06 NOTE — ED Provider Notes (Signed)
[facility] Emergency Department Provider Note  ____________________________________________  Time seen: Approximately 12:20 AM  I have reviewed the triage vital signs and the nursing notes.   HISTORY  Chief Complaint Hyperglycemia   HPI Rhonda Mccall is a 55 y.o. female with a history of type 2 diabetes, hypertension, hyperlipidemia, abnormal uterine bleeding, uterine fibroids who presents for evaluation of hyperglycemia.  Patient underwent a D&C 4 days ago for dysfunctional uterine bleeding.  She reports that she has been bleeding since the surgery.  Over the last 24 hours she has been going through a overnight heavy pad every 2 hours.  She is passing clots.  She reports that she was supposed to be on TXA 3 times daily since the surgery.  Apparently, there was a complication during the surgery leading to emergent intubation and patient had pretty significant swelling of her vocal cords and her throat.  She was having difficulty for the first 2 days after the surgery taking any pills.  So she did not start the TXA until yesterday.  They also put her on some steroids with she has been taking and since then her sugars have been going up.  She denies syncope, chest pain or shortness of breath.  She has had some mild lower abdominal cramping.  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 Authorizing  Provider  amLODipine (NORVASC) 5 MG tablet Take 5 mg by mouth daily.    [provider]  aspirin EC 81 MG EC tablet Take 1 tablet (81 mg total) by mouth daily. 09/17/18   Loletha Grayer, MD  Dulaglutide 1.5 MG/0.5ML SOPN Inject 1.5 mg into the skin once a week. 04/05/18   [provider]  Insulin Degludec 200 UNIT/ML SOPN Inject 30 Units into the skin daily. 11/29/17   [provider]  losartan (COZAAR) 100 MG tablet Take 100 mg by mouth daily.    [provider]  metFORMIN (GLUCOPHAGE) 500 MG tablet Take 1,000 mg by mouth 2 (two) times daily with a meal.     [provider]  oxyCODONE (OXY IR/ROXICODONE) 5 MG immediate release tablet Take 1 tablet (5 mg total) by mouth every 4 (four) hours as needed. 05/02/21   Benjaman Kindler, MD  prochlorperazine (COMPAZINE) 10 MG tablet Take 1 tablet (10 mg total) by mouth every 6 (six) hours as needed for nausea or vomiting. 09/10/19   Merlyn Lot, MD  rosuvastatin (CRESTOR) 20 MG tablet Take 1 tablet (20 mg total) by mouth daily. 09/16/18   Loletha Grayer, MD  tranexamic acid (LYSTEDA) 650 MG TABS tablet Take 2 tablets (1,300 mg total) by mouth 3 (three) times daily. Take during menses for a maximum of five days 05/02/21   Benjaman Kindler, MD    Allergies Sunscreens and Victoza [liraglutide]  Family History  Problem Relation Age of Onset   Diabetes Mother    Hypertension Mother  Stroke Mother     Social History Social History   Tobacco Use   Smoking status: Never   Smokeless tobacco: Never  Substance Use Topics   Alcohol use: No   Drug use: Never    Review of Systems  Constitutional: Negative for fever. Eyes: Negative for visual changes. ENT: Negative for sore throat. Neck: No neck pain  Cardiovascular: Negative for chest pain. Respiratory: Negative for shortness of breath. Gastrointestinal: Negative for abdominal pain, vomiting or diarrhea. Genitourinary: Negative for dysuria. +  vaginal bleeding Musculoskeletal: Negative for back pain. Skin: Negative for rash. Neurological: Negative for headaches, weakness or numbness. Psych: No SI or HI  ____________________________________________   PHYSICAL EXAM:  VITAL SIGNS: Vitals:   05/05/21 2105 05/05/21 2332  BP: (!) 164/97 (!) 159/84  Pulse: (!) 101 80  Resp: 20 18  Temp: 98.4 F (36.9 C)   SpO2: 99% 100%     Constitutional: Alert and oriented. Well appearing and in no apparent distress. HEENT:      Head: Normocephalic and atraumatic.         Eyes: Conjunctivae are normal. Sclera is non-icteric.       Mouth/Throat: Mucous membranes are moist.       Neck: Supple with no signs of meningismus. Cardiovascular: Regular rate and rhythm. No murmurs, gallops, or rubs. 2+ symmetrical distal pulses are present in all extremities. No JVD. Respiratory: Normal respiratory effort. Lungs are clear to auscultation bilaterally.  Gastrointestinal: Soft, non tender, and non distended with positive bowel sounds. No rebound or guarding. Genitourinary: No CVA tenderness. Musculoskeletal:  No edema, cyanosis, or erythema of extremities. Neurologic: Normal speech and language. Face is symmetric. Moving all extremities. No gross focal neurologic deficits are appreciated. Skin: Skin is warm, dry and intact. No rash noted. Psychiatric: Mood and affect are normal. Speech and behavior are normal.  ____________________________________________   LABS (all labs ordered are listed, but only abnormal results are displayed)  Labs Reviewed  BASIC METABOLIC PANEL - Abnormal; Notable for the following components:      Result Value   Sodium 133 (*)    Chloride 96 (*)    Glucose, Bld 644 (*)    Creatinine, Ser 1.02 (*)    All other components within normal limits  CBC - Abnormal; Notable for the following components:   RBC 2.97 (*)    Hemoglobin 7.8 (*)    HCT 24.7 (*)    RDW 17.3 (*)    All other components within normal limits   URINALYSIS, COMPLETE (UACMP) WITH MICROSCOPIC - Abnormal; Notable for the following components:   Color, Urine AMBER (*)    APPearance HAZY (*)    Glucose, UA >=500 (*)    Hgb urine dipstick MODERATE (*)    Protein, ur 30 (*)    Leukocytes,Ua TRACE (*)    RBC / HPF >50 (*)    WBC, UA >50 (*)    All other components within normal limits  CBG MONITORING, ED - Abnormal; Notable for the following components:   Glucose-Capillary >600 (*)    All other components within normal limits  RESP PANEL BY RT-PCR (FLU A&B, COVID) ARPGX2  CBG MONITORING, ED  TYPE AND SCREEN  PREPARE RBC (CROSSMATCH)   ____________________________________________  EKG  none  ____________________________________________  RADIOLOGY  none   ____________________________________________   PROCEDURES  Procedure(s) performed:yes .1-3 Lead EKG Interpretation  Date/Time: 05/06/2021 12:23 AM Performed by: Rudene Re, MD Authorized by: Rudene Re, MD     Interpretation: non-specific  ECG rate assessment: normal     Rhythm: sinus rhythm     Ectopy: none     Conduction: normal     Critical Care performed: yes  CRITICAL CARE Performed by: Rudene Re  ?  Total critical care time: 30 min  Critical care time was exclusive of separately billable procedures and treating other patients.  Critical care was necessary to treat or prevent imminent or life-threatening deterioration.  Critical care was time spent personally by me on the following activities: development of treatment plan with patient and/or surrogate as well as nursing, discussions with consultants, evaluation of patient's response to treatment, examination of patient, obtaining history from patient or surrogate, ordering and performing treatments and interventions, ordering and review of laboratory studies, ordering and review of radiographic studies, pulse oximetry and re-evaluation of patient's  condition.  ____________________________________________   INITIAL IMPRESSION / ASSESSMENT AND PLAN / ED COURSE  55 y.o. female with a history of type 2 diabetes, hypertension, hyperlipidemia, abnormal uterine bleeding, uterine fibroids who presents for evaluation of hyperglycemia and vaginal bleeding.  Patient status post D&C 4 days ago.  Continues to have heavy vaginal bleeding.  Had some swelling of her throat due to intubation for the procedure and therefore was placed on steroids which are probably contributing to her hyperglycemia.   Last blood work checked was in July and she had a hemoglobin of 12.9.  Her hemoglobin right now is 7.8.  She is otherwise hemodynamically stable with a heart rate of 80 and a BP of 159/84.  She is hyperglycemic with a sugar of 644 but no signs of DKA with a normal anion gap, normal bicarb, no ketonuria.  We will treat that with IV hydration.  Type and cross active.  Will give IV TXA.  Discussed with Dr. Ouida Sills from OB/GYN who will come to the ED to evaluate patient for possible emergent hysterectomy.  Patient to be admitted to his service.      _____________________________________________ Please note:  Patient was evaluated in Emergency Department today for the symptoms described in the history of present illness. Patient was evaluated in the context of the global COVID-19 pandemic, which necessitated consideration that the patient might be at risk for infection with the SARS-CoV-2 virus that causes COVID-19. Institutional protocols and algorithms that pertain to the evaluation of patients at risk for COVID-19 are in a state of rapid change based on information released by regulatory bodies including the CDC and federal and state organizations. These policies and algorithms were followed during the patient's care in the ED.  Some ED evaluations and interventions may be delayed as a result of limited staffing during the pandemic.   [place] Controlled Substance  Database was reviewed by me. ____________________________________________   FINAL CLINICAL IMPRESSION(S) / ED DIAGNOSES   Final diagnoses:  Uterine bleeding  Acute blood loss anemia  Hyperglycemia      NEW MEDICATIONS STARTED DURING THIS VISIT:  ED Discharge Orders     None        Note:  This document was prepared using Dragon voice recognition software and may include unintentional dictation errors.    Alfred Levins, [place], MD 05/06/21 Laureen Abrahams

## 2021-05-06 NOTE — Progress Notes (Signed)
Hospitalist consult follow-up note  Patient 55 year old female admitted to GYN service for inpatient management of menorrhagia and acute blood loss anemia.  Patient is hemodynamically stable.  Most recent hemoglobin 7.8.  Hospitalist consulted for insulin-dependent diabetes management with hyperglycemia Initial blood glucose 665 Decreased to 394 after aggressive IV hydration Restarted on basal bolus regimen  Plan: Continue Semglee 40 units daily Resistant sliding scale every 4 hours Every 4 hours CBGs Continue glimepiride 4 mg daily Continue Farxiga 10 mg p.o. daily Check hemoglobin A1c Consult placed to diabetes coordinator  Diet per primary team.  If diet advanced recommend carb modified diet  Thank you for the consult.  [facility] hospitalist service to continue to follow the patient with you.  Please reach out with any questions.  Ralene Muskrat MD

## 2021-05-06 NOTE — Telephone Encounter (Signed)
Received call from Dr. Migdalia Dk office asking for referral with gyn oncology this Wednesday to evaluate if hysterectomy could be done robotically. Appointment arranged and notified mother/baby unit with the details. They will give Rhonda Mccall her appointment details.

## 2021-05-06 NOTE — Consult Note (Signed)
Consult History and Physical   SERVICE: Gynecology   Patient Name: Rhonda Mccall Patient MRN:   [id number]  CC: vaginal bleeding and elevated blood sugar   HPI: Rhonda Mccall is a 55 y.o. [id number] with ongoing vaginal bleeding following D&C on Monday, 05/02/2021.  Bleeding initially improved after the procedure but she was unable to take the prescribed TXA d/t sore throat and inflammation after intubation.  She took the first pill this morning but heavy bleeding had already resumed.  She reports changing a super overnight pad every 2 hours with clots.  Incidentally, her blood sugar at home was 594.  She came to the ED for evaluation of heavy bleeding, feeling dizzy, and elevated blood sugar at home.    Rhonda Mccall has a long history of menorrhagia since menarche.  Prior to the [facility] on Monday she woke up covered in blood and was bleeding through pads until the procedure.  Vaginal bleeding has always been heavy but it has gotten worse this year.    Review of Systems  Constitutional:  Negative for chills and fever.  Eyes:  Negative for blurred vision.  Respiratory:  Negative for cough.        + sore throat after intubation  Cardiovascular:  Negative for chest pain and palpitations.  Gastrointestinal:  Negative for abdominal pain and nausea.  Skin:  Negative for rash.  Neurological:  Positive for dizziness.       States she fell at home  Psychiatric/Behavioral:  The patient is nervous/anxious.  (Appropriate for situation)   Past Obstetrical History: OB History     Gravida  4   Para      Term      Preterm      AB  4   Living         SAB  1   IAB  3   Ectopic      Multiple      Live Births              Past Gynecologic History: -Hx of AUB since menarche, hx of uterine fibroids; Dr. Leafy Ro evaluated her 09/2019 for [place] f/u; [place], LH, E2 showed perimenopause & not conclusive  TVUS 05/02/2021 Uterus 19 x 14 x 9.6 cm  EE 16 mm  Fibroid uterus -rt fundal 9.3 cm  -post  5.6 cm  -fundal 6 cm  Neither ovaries seen    Past Medical History: Past Medical History:  Diagnosis Date   Diabetes mellitus without complication ([place])    Hyperlipidemia    Hypertension     Past Surgical History:   Past Surgical History:  Procedure Laterality Date   COLONOSCOPY WITH ESOPHAGOGASTRODUODENOSCOPY (EGD)     HYSTEROSCOPY WITH D & C N/A 05/02/2021   Procedure: DILATATION AND CURETTAGE /HYSTEROSCOPY;  Surgeon: Benjaman Kindler, MD;  Location: [facility] ORS;  Service: Gynecology;  Laterality: N/A;    Family History:  family history includes Diabetes in her mother; Hypertension in her mother; Stroke in her mother.  Social History:  Social History   Socioeconomic History   Marital status: Single    Spouse name: Not on file   Number of children: Not on file   Years of education: Not on file   Highest education level: Not on file  Occupational History   Not on file  Tobacco Use   Smoking status: Never   Smokeless tobacco: Never  Substance and Sexual Activity   Alcohol use: No   Drug use: Never   Sexual activity: Not  on file  Other Topics Concern   Not on file  Social History Narrative   Not on file   Social Determinants of Health   Financial Resource Strain: Not on file  Food Insecurity: Not on file  Transportation Needs: Not on file  Physical Activity: Not on file  Stress: Not on file  Social Connections: Not on file  Intimate Partner Violence: Not on file    Home Medications:  Medications reconciled in EPIC  No current facility-administered medications on file prior to encounter.   Current Outpatient Medications on File Prior to Encounter  Medication Sig Dispense Refill   amLODipine (NORVASC) 5 MG tablet Take 5 mg by mouth daily.     aspirin EC 81 MG EC tablet Take 1 tablet (81 mg total) by mouth daily. 30 tablet 0   cholecalciferol (VITAMIN D3) 25 MCG (1000 UNIT) tablet Take 5,000 Units by mouth daily.     dapagliflozin propanediol (FARXIGA) 10 MG  TABS tablet Take 10 mg by mouth daily.     Dulaglutide 1.5 MG/0.5ML SOPN Inject 1.5 mg into the skin once a week.     glimepiride (AMARYL) 4 MG tablet Take 4 mg by mouth daily with breakfast.     Insulin Degludec 200 UNIT/ML SOPN Inject 40 Units into the skin daily.     losartan (COZAAR) 100 MG tablet Take 100 mg by mouth daily.     oxyCODONE (OXY IR/ROXICODONE) 5 MG immediate release tablet Take 1 tablet (5 mg total) by mouth every 4 (four) hours as needed. 6 tablet 0   predniSONE (DELTASONE) 10 MG tablet Take 1 tablet by mouth daily.     rosuvastatin (CRESTOR) 20 MG tablet Take 1 tablet (20 mg total) by mouth daily. 30 tablet 0   tranexamic acid (LYSTEDA) 650 MG TABS tablet Take 2 tablets (1,300 mg total) by mouth 3 (three) times daily. Take during menses for a maximum of five days 30 tablet 2   metFORMIN (GLUCOPHAGE) 500 MG tablet Take 1,000 mg by mouth 2 (two) times daily with a meal.      prochlorperazine (COMPAZINE) 10 MG tablet Take 1 tablet (10 mg total) by mouth every 6 (six) hours as needed for nausea or vomiting. (Patient not taking: No sig reported) 12 tablet 0    Allergies:  Allergies  Allergen Reactions   Sunscreens Hives   Victoza [Liraglutide] Other (See Comments)    Yeast infections    Physical Exam:  Temp:  [98.4 F (36.9 C)] 98.4 F (36.9 C) (08/25 2105) Pulse Rate:  [80-101] 80 (08/25 2332) Resp:  [18-20] 18 (08/25 2332) BP: (159-164)/(84-97) 159/84 (08/25 2332) SpO2:  [99 %-100 %] 100 % (08/25 2332) Weight:  [142.4 kg] 142.4 kg (08/25 2107)  Physical Exam Constitutional:      Appearance: Normal appearance. She is obese.  HENT:     Head: Normocephalic.  Pulmonary:     Effort: Pulmonary effort is normal.  Abdominal:     Palpations: Abdomen is soft.     Tenderness: There is no abdominal tenderness.  Genitourinary:    Vagina: Bleeding present.     Uterus: Enlarged.   Musculoskeletal:        General: Normal range of motion.     Cervical back: Normal range  of motion.  Skin:    General: Skin is warm and dry.     Capillary Refill: Capillary refill takes less than 2 seconds.  Neurological:     Mental Status: She is alert and oriented  to person, place, and time.  Psychiatric:     Comments: Anxious about interventions but appropriate for situation     Labs/Studies:   CBC and Coags:  Lab Results  Component Value Date   WBC 8.7 05/05/2021   NEUTOPHILPCT 61 09/10/2019   EOSPCT 2 09/10/2019   BASOPCT 1 09/10/2019   LYMPHOPCT 29 09/10/2019   HGB 7.8 (L) 05/05/2021   HCT 24.7 (L) 05/05/2021   MCV 83.2 05/05/2021   PLT 253 05/05/2021   CMP:  Lab Results  Component Value Date   NA 133 (L) 05/05/2021   K 4.8 05/05/2021   CL 96 (L) 05/05/2021   CO2 28 05/05/2021   BUN 18 05/05/2021   CREATININE 1.02 (H) 05/05/2021   CREATININE 0.99 09/10/2019   CREATININE 0.89 09/15/2018   PROT 7.4 09/10/2019   BILITOT 1.1 09/10/2019   ALT 19 09/10/2019   AST 16 09/10/2019   ALKPHOS 54 09/10/2019    Other Imaging: No results found.   Assessment / Plan:   Redonna Viar is a 55 y.o. [id number]  who presents with menorrhagia.   1. Admit to GYN service for inpatient management of menorrhagia  - Discussed plan of care with Dr. Ouida Sills. - IV TXA ordered  - Plan for IV estrogen to help with stabilizing the uterine lining  - pRBC ordered d/t symptomatic anemia  - Close surveillance of vaginal bleeding  - Will attempt control of vaginal bleeding with medical management but surgical intervention may be needed if she continues to have continued vaginal bleeding despite medications.    2. Hyperglycemia  - Glucose decreased from 665 -> 394 after IV hydration  - Hospitalist team consulted for assistance with management of blood sugars while inpatient    Thank you for the opportunity to be involved with this patient's care.  ----- Drinda Butts, CNM Midwife [facility], Department of [facility]

## 2021-05-06 NOTE — Consult Note (Signed)
SERVICE: Gynecology   Patient Name: Rhonda Mccall Patient MRN:   [id number]  CC: vaginal bleeding and elevated blood sugar   HPI: Rhonda Mccall is a 55 y.o. [id number] with vaginal bleeding following D&C on Monday, 05/02/2021.  Pt reports bleeding has improved today.  Review of Systems  Constitutional:  Negative for chills and fever.  Eyes:  Negative for blurred vision.  Respiratory:  Negative for cough.   Cardiovascular:  Negative for chest pain and palpitations.  Gastrointestinal:  Positive for abdominal pain (cramping - started this am). Negative for nausea.  Skin:  Negative for rash.  Neurological: Negative.   Psychiatric/Behavioral: Negative.      Past Obstetrical History: OB History     Gravida  4   Para      Term      Preterm      AB  4   Living         SAB  1   IAB  3   Ectopic      Multiple      Live Births              Past Gynecologic History: -Hx of AUB since menarche, hx of uterine fibroids; Dr. Leafy Ro evaluated her 09/2019 for [place] f/u; [place], LH, E2 showed perimenopause & not conclusive  TVUS 05/02/2021 Uterus 19 x 14 x 9.6 cm  EE 16 mm  Fibroid uterus -rt fundal 9.3 cm  -post 5.6 cm  -fundal 6 cm  Neither ovaries seen    Past Medical History: Past Medical History:  Diagnosis Date   Diabetes mellitus without complication ([place])    Hyperlipidemia    Hypertension     Past Surgical History:   Past Surgical History:  Procedure Laterality Date   COLONOSCOPY WITH ESOPHAGOGASTRODUODENOSCOPY (EGD)     HYSTEROSCOPY WITH D & C N/A 05/02/2021   Procedure: DILATATION AND CURETTAGE /HYSTEROSCOPY;  Surgeon: Benjaman Kindler, MD;  Location: [facility] ORS;  Service: Gynecology;  Laterality: N/A;    Family History:  family history includes Diabetes in her mother; Hypertension in her mother; Stroke in her mother.  Social History:  Social History   Socioeconomic History   Marital status: Single    Spouse name: Not on file   Number of  children: Not on file   Years of education: Not on file   Highest education level: Not on file  Occupational History   Not on file  Tobacco Use   Smoking status: Never   Smokeless tobacco: Never  Substance and Sexual Activity   Alcohol use: No   Drug use: Never   Sexual activity: Not on file  Other Topics Concern   Not on file  Social History Narrative   Not on file   Social Determinants of Health   Financial Resource Strain: Not on file  Food Insecurity: Not on file  Transportation Needs: Not on file  Physical Activity: Not on file  Stress: Not on file  Social Connections: Not on file  Intimate Partner Violence: Not on file    Home Medications:  Medications reconciled in EPIC  No current facility-administered medications on file prior to encounter.   Current Outpatient Medications on File Prior to Encounter  Medication Sig Dispense Refill   amLODipine (NORVASC) 5 MG tablet Take 5 mg by mouth daily.     aspirin EC 81 MG EC tablet Take 1 tablet (81 mg total) by mouth daily. 30 tablet 0   cholecalciferol (VITAMIN D3) 25 MCG (  1000 UNIT) tablet Take 5,000 Units by mouth daily.     dapagliflozin propanediol (FARXIGA) 10 MG TABS tablet Take 10 mg by mouth daily.     Dulaglutide 1.5 MG/0.5ML SOPN Inject 1.5 mg into the skin once a week.     glimepiride (AMARYL) 4 MG tablet Take 4 mg by mouth daily with breakfast.     Insulin Degludec 200 UNIT/ML SOPN Inject 40 Units into the skin daily.     losartan (COZAAR) 100 MG tablet Take 100 mg by mouth daily.     oxyCODONE (OXY IR/ROXICODONE) 5 MG immediate release tablet Take 1 tablet (5 mg total) by mouth every 4 (four) hours as needed. 6 tablet 0   predniSONE (DELTASONE) 10 MG tablet Take 1 tablet by mouth daily.     rosuvastatin (CRESTOR) 20 MG tablet Take 1 tablet (20 mg total) by mouth daily. 30 tablet 0    Allergies:  Allergies  Allergen Reactions   Sunscreens Hives   Victoza [Liraglutide] Other (See Comments)    Yeast  infections    Physical Exam:  Temp:  [98 F (36.7 C)-98.4 F (36.9 C)] 98 F (36.7 C) (08/26 0900) Pulse Rate:  [69-101] 72 (08/26 0900) Resp:  [16-20] 17 (08/26 0900) BP: (136-164)/(76-124) 146/85 (08/26 0900) SpO2:  [97 %-100 %] 100 % (08/26 0900) Weight:  [142.4 kg] 142.4 kg (08/25 2107)  Physical Exam Constitutional:      Appearance: Normal appearance. She is obese.  HENT:     Head: Normocephalic.  Cardiovascular:     Rate and Rhythm: Normal rate.  Pulmonary:     Effort: Pulmonary effort is normal.  Abdominal:     Palpations: Abdomen is soft.  Genitourinary:    Vagina: Bleeding present.  Musculoskeletal:        General: Normal range of motion.     Cervical back: Normal range of motion.  Skin:    General: Skin is warm and dry.  Neurological:     Mental Status: She is alert and oriented to person, place, and time.  Psychiatric:        Mood and Affect: Mood normal.        Behavior: Behavior normal.    Labs/Studies:   CBC and Coags:  Lab Results  Component Value Date   WBC 9.8 05/06/2021   NEUTOPHILPCT 61 09/10/2019   EOSPCT 2 09/10/2019   BASOPCT 1 09/10/2019   LYMPHOPCT 29 09/10/2019   HGB 8.5 (L) 05/06/2021   HCT 26.3 (L) 05/06/2021   MCV 83.2 05/06/2021   PLT 143 (L) 05/06/2021   CMP:  Lab Results  Component Value Date   NA 133 (L) 05/05/2021   K 4.8 05/05/2021   CL 96 (L) 05/05/2021   CO2 28 05/05/2021   BUN 18 05/05/2021   CREATININE 1.02 (H) 05/05/2021   CREATININE 0.99 09/10/2019   CREATININE 0.89 09/15/2018   PROT 7.4 09/10/2019   BILITOT 1.1 09/10/2019   ALT 19 09/10/2019   AST 16 09/10/2019   ALKPHOS 54 09/10/2019    Other Imaging: No results found.   Assessment / Plan:   Rhonda Mccall is a 55 y.o. [id number]  who presents with menorrhagia.   1. Admit to GYN service for inpatient management of menorrhagia  - Updated Dr. Leafy Ro on status, she reports she will see pt soon - 1 unit pRBC completed - increase in H/H to 8.5 - Pain  medication ordered for cramping  2. Hyperglycemia  - Seen by Hospitalist this am  -----  Linda Hedges, CNM Midwife [facility], Department of OB/GYN [facility]

## 2021-05-06 NOTE — Progress Notes (Signed)
Subjective: Bleeding improved after iv lysteda.  No SOB or CP. Resting quietly.  Last blood sugar 94, declines her long acting insulin for now Last pad check 10g of blood  Objective: Vital signs in last 24 hours: Temp:  [98 F (36.7 C)-98.4 F (36.9 C)] 98 F (36.7 C) (08/26 1223) Pulse Rate:  [69-101] 90 (08/26 1223) Resp:  [16-20] 18 (08/26 1223) BP: (136-164)/(76-124) 140/76 (08/26 1223) SpO2:  [97 %-100 %] 99 % (08/26 1223) Weight:  [142.4 kg] 142.4 kg (08/25 2107)  Intake/Output  Intake/Output Summary (Last 24 hours) at 05/06/2021 1252 Last data filed at 05/06/2021 1200 Gross per 24 hour  Intake 395.33 ml  Output 10 ml  Net 385.33 ml    Physical Exam:  General: Alert and oriented. GI: Soft, Nondistended. Fundus at umbilicus  Extremities: Nontender, no erythema, no edema.   Recent Results (from the past 2160 hour(s))  Type and screen [place]     Status: None   Collection Time: 05/02/21  2:59 PM  Result Value Ref Range   ABO/RH(D) A POS    Antibody Screen NEG    Sample Expiration      05/05/2021,2359 Performed at [facility], [address] [place], [place] [address]   Glucose, capillary     Status: Abnormal   Collection Time: 05/02/21  2:59 PM  Result Value Ref Range   Glucose-Capillary 302 (H) 70 - 99 mg/dL    Comment: Glucose reference range applies only to samples taken after fasting for at least 8 hours.  ABO/Rh     Status: None   Collection Time: 05/02/21  3:04 PM  Result Value Ref Range   ABO/RH(D)      A POS Performed at [facility], [address] [place], [place] [address]   Pregnancy, urine POC     Status: None   Collection Time: 05/02/21  3:08 PM  Result Value Ref Range   Preg Test, Ur NEGATIVE NEGATIVE    Comment:        THE SENSITIVITY OF THIS METHODOLOGY IS >24 mIU/mL   Surgical pathology     Status: None   Collection Time: 05/02/21  6:58 PM  Result Value Ref Range   SURGICAL  PATHOLOGY      SURGICAL PATHOLOGY CASE: [phone] PATIENT: Gabriella Goshert Surgical Pathology Report     Specimen Submitted: A. Endocervical curettings B. Endometrial curettings  Clinical History: Menorrhagia      DIAGNOSIS: A. ENDOCERVIX; CURETTAGE: - BENIGN SQUAMOUS AND ENDOCERVICAL EPITHELIUM. - NEGATIVE FOR DYSPLASIA AND MALIGNANCY.  B. ENDOMETRIUM; CURETTAGE: - SCANT BENIGN ENDOMETRIAL TISSUE, NEGATIVE FOR ATYPIA / EIN AND MALIGNANCY. - BENIGN SQUAMOUS AND ENDOCERVICAL MUCOSA, NEGATIVE FOR DYSPLASIA AND MALIGNANCY.   GROSS DESCRIPTION: A. Labeled: Endocervical curetting Received: Formalin Collection time: 6:58 PM on 05/02/2021 Placed into formalin time: 6:59 PM on 05/02/2021 Tissue fragment(s): Multiple Size: Aggregate, 1.5 x 0.7 x 0.1 cm Description: Received on a Telfa pad are fragments of tan-brown soft tissue. Entirely submitted in 1 cassette.  B. Labeled: Endometrial curetting Received: Formalin Collection time: 7:15 PM on 05/02/2021 Placed into fo rmalin time: 7:20 PM on 05/02/2021 Tissue fragment(s): Multiple Size: Aggregate, 2.4 x 2.2 x 0.5 cm Description: Received on 2 Telfa pads are fragments of red-brown soft tissue and blood clot. Entirely submitted in 1 cassette.  RB 05/03/2021  Final Diagnosis performed by Betsy Pries, MD.   Electronically signed 05/04/2021 11:15:02AM The electronic signature indicates that the named Attending Pathologist has evaluated the  specimen Technical component performed at [place] [place], [address], [place], [place] [address] Lab: [phone] Dir: Rush Farmer, MD, MMM  Professional component performed at [facility], [facility], [address], [place], [place] [address] Lab: [phone] Dir: Dellia Nims. Rubinas, MD   Glucose, capillary     Status: Abnormal   Collection Time: 05/02/21  7:42 PM  Result Value Ref Range   Glucose-Capillary 243 (H) 70 - 99 mg/dL    Comment: Glucose reference range  applies only to samples taken after fasting for at least 8 hours.  Basic metabolic panel     Status: Abnormal   Collection Time: 05/05/21  9:10 PM  Result Value Ref Range   Sodium 133 (L) 135 - 145 mmol/L   Potassium 4.8 3.5 - 5.1 mmol/L   Chloride 96 (L) 98 - 111 mmol/L   CO2 28 22 - 32 mmol/L   Glucose, Bld 644 (HH) 70 - 99 mg/dL    Comment: CRITICAL RESULT CALLED TO, READ BACK BY AND VERIFIED WITH LISA THOMPSON'@2158'$  05/05/21 RH Glucose reference range applies only to samples taken after fasting for at least 8 hours.    BUN 18 6 - 20 mg/dL   Creatinine, Ser 1.02 (H) 0.44 - 1.00 mg/dL   Calcium 8.9 8.9 - 10.3 mg/dL   GFR, Estimated >60 >60 mL/min    Comment: (NOTE) Calculated using the CKD-EPI Creatinine Equation (2021)    Anion gap 9 5 - 15    Comment: Performed at [facility], [address] [place], [place] [address]  CBC     Status: Abnormal   Collection Time: 05/05/21  9:10 PM  Result Value Ref Range   WBC 8.7 4.0 - 10.5 K/uL   RBC 2.97 (L) 3.87 - 5.11 MIL/uL   Hemoglobin 7.8 (L) 12.0 - 15.0 g/dL   HCT 24.7 (L) 36.0 - 46.0 %   MCV 83.2 80.0 - 100.0 fL   MCH 26.3 26.0 - 34.0 pg   MCHC 31.6 30.0 - 36.0 g/dL   RDW 17.3 (H) 11.5 - 15.5 %   Platelets 253 150 - 400 K/uL   nRBC 0.2 0.0 - 0.2 %    Comment: Performed at [facility], [address] [place], [place] [address]  Urinalysis, Complete w Microscopic Urine, Clean Catch     Status: Abnormal   Collection Time: 05/05/21  9:10 PM  Result Value Ref Range   Color, Urine AMBER (A) YELLOW    Comment: BIOCHEMICALS MAY BE AFFECTED BY COLOR   APPearance HAZY (A) CLEAR   Specific Gravity, Urine 1.028 1.005 - 1.030   pH 7.0 5.0 - 8.0   Glucose, UA >=500 (A) NEGATIVE mg/dL   Hgb urine dipstick MODERATE (A) NEGATIVE   Bilirubin Urine NEGATIVE NEGATIVE   Ketones, ur NEGATIVE NEGATIVE mg/dL   Protein, ur 30 (A) NEGATIVE mg/dL   Nitrite NEGATIVE NEGATIVE   Leukocytes,Ua TRACE (A) NEGATIVE   RBC / HPF >50  (H) 0 - 5 RBC/hpf   WBC, UA >50 (H) 0 - 5 WBC/hpf   Bacteria, UA NONE SEEN NONE SEEN   Squamous Epithelial / LPF NONE SEEN 0 - 5    Comment: Performed at [facility], [address] [place],  [address]  CBG monitoring, ED     Status: Abnormal   Collection Time: 05/05/21  9:11 PM  Result Value Ref Range   Glucose-Capillary >600 (HH) 70 - 99 mg/dL    Comment: Glucose reference range applies only to  samples taken after fasting for at least 8 hours.   Comment 1 Notify RN    Comment 2 Document in Chart   Resp Panel by RT-PCR (Flu A&B, Covid) Nasopharyngeal Swab     Status: None   Collection Time: 05/05/21 11:46 PM   Specimen: Nasopharyngeal Swab; Nasopharyngeal(NP) swabs in vial transport medium  Result Value Ref Range   SARS Coronavirus 2 by RT PCR NEGATIVE NEGATIVE    Comment: (NOTE) SARS-CoV-2 target nucleic acids are NOT DETECTED.  The SARS-CoV-2 RNA is generally detectable in upper respiratory specimens during the acute phase of infection. The lowest concentration of SARS-CoV-2 viral copies this assay can detect is 138 copies/mL. A negative result does not preclude SARS-Cov-2 infection and should not be used as the sole basis for treatment or other patient management decisions. A negative result may occur with  improper specimen collection/handling, submission of specimen other than nasopharyngeal swab, presence of viral mutation(s) within the areas targeted by this assay, and inadequate number of viral copies(<138 copies/mL). A negative result must be combined with clinical observations, patient history, and epidemiological information. The expected result is Negative.  Fact Sheet for Patients:  EntrepreneurPulse.com.au  Fact Sheet for Healthcare Providers:  IncredibleEmployment.be  This test is no t yet approved or cleared by the [place] FDA and  has been authorized for detection and/or diagnosis of SARS-CoV-2  by FDA under an Emergency Use Authorization (EUA). This EUA will remain  in effect (meaning this test can be used) for the duration of the COVID-19 declaration under Section 564(b)(1) of the Act, 21 U.S.C.section 360bbb-3(b)(1), unless the authorization is terminated  or revoked sooner.       Influenza A by PCR NEGATIVE NEGATIVE   Influenza B by PCR NEGATIVE NEGATIVE    Comment: (NOTE) The Xpert Xpress SARS-CoV-2/FLU/RSV plus assay is intended as an aid in the diagnosis of influenza from Nasopharyngeal swab specimens and should not be used as a sole basis for treatment. Nasal washings and aspirates are unacceptable for Xpert Xpress SARS-CoV-2/FLU/RSV testing.  Fact Sheet for Patients: EntrepreneurPulse.com.au  Fact Sheet for Healthcare Providers: IncredibleEmployment.be  This test is not yet approved or cleared by the [place] FDA and has been authorized for detection and/or diagnosis of SARS-CoV-2 by FDA under an Emergency Use Authorization (EUA). This EUA will remain in effect (meaning this test can be used) for the duration of the COVID-19 declaration under Section 564(b)(1) of the Act, 21 U.S.C. section 360bbb-3(b)(1), unless the authorization is terminated or revoked.  Performed at [facility], [address] [place], [place] [address]   Type and screen [place]     Status: None (Preliminary result)   Collection Time: 05/05/21 11:46 PM  Result Value Ref Range   ABO/RH(D) A POS    Antibody Screen NEG    Sample Expiration 05/08/2021,2359    Unit Number [id number]    Blood Component Type RED CELLS,LR    Unit division 00    Status of Unit ALLOCATED    Transfusion Status OK TO TRANSFUSE    Crossmatch Result Compatible    Unit Number [id number]    Blood Component Type RED CELLS,LR    Unit division 00    Status of Unit ISSUED    Transfusion Status OK TO TRANSFUSE    Crossmatch Result       Compatible Performed at [facility], [address], [place], [place] [address]   BPAM RBC     Status: None (Preliminary  result)   Collection Time: 05/05/21 11:46 PM  Result Value Ref Range   Blood Product Unit Number [id number]    PRODUCT CODE [id number]    Unit Type and Rh [id number]    Blood Product Expiration Date [id number]    ISSUE DATE / TIME [id number]    Blood Product Unit Number [id number]    PRODUCT CODE [id number]    Unit Type and Rh 6200    Blood Product Expiration Date [id number]   Prepare RBC (crossmatch)     Status: None   Collection Time: 05/06/21 12:24 AM  Result Value Ref Range   Order Confirmation      ORDER PROCESSED BY BLOOD BANK Performed at [facility], [address] [place], [place] [address]   CBG monitoring, ED     Status: Abnormal   Collection Time: 05/06/21 12:44 AM  Result Value Ref Range   Glucose-Capillary 344 (H) 70 - 99 mg/dL    Comment: Glucose reference range applies only to samples taken after fasting for at least 8 hours.   Comment 1 Notify RN    Comment 2 Document in Chart   Hemoglobin A1c     Status: Abnormal   Collection Time: 05/06/21  4:17 AM  Result Value Ref Range   Hgb A1c MFr Bld 8.5 (H) 4.8 - 5.6 %    Comment: (NOTE) Pre diabetes:          5.7%-6.4%  Diabetes:              >6.4%  Glycemic control for   <7.0% adults with diabetes    Mean Plasma Glucose 197.25 mg/dL    Comment: Performed at [facility] [address] [place], [place] [address]  CBG monitoring, ED     Status: Abnormal   Collection Time: 05/06/21  8:23 AM  Result Value Ref Range   Glucose-Capillary 134 (H) 70 - 99 mg/dL    Comment: Glucose reference range applies only to samples taken after fasting for at least 8 hours.  CBC     Status: Abnormal   Collection Time: 05/06/21  8:24 AM  Result Value Ref Range   WBC 9.8 4.0 - 10.5 K/uL   RBC 3.16 (L) 3.87 - 5.11 MIL/uL   Hemoglobin 8.5 (L) 12.0 - 15.0 g/dL   HCT 26.3  (L) 36.0 - 46.0 %   MCV 83.2 80.0 - 100.0 fL   MCH 26.9 26.0 - 34.0 pg   MCHC 32.3 30.0 - 36.0 g/dL   RDW 17.2 (H) 11.5 - 15.5 %   Platelets 143 (L) 150 - 400 K/uL   nRBC 0.0 0.0 - 0.2 %    Comment: Performed at [facility], [address] [place], [place] [address]  Glucose, capillary     Status: None   Collection Time: 05/06/21 12:19 PM  Result Value Ref Range   Glucose-Capillary 94 70 - 99 mg/dL    Comment: Glucose reference range applies only to samples taken after fasting for at least 8 hours.     Assessment/Plan:  S/p 1u pRBC with a reasonable bump in h/h  If we can get her bleeding under control with TXA, we have some options. I discussed with her an urgent referral to [place], an attempted RATLH, BSO with me with a 50/50 chance of converting to a TAH, a referral to vascular surgery for consideration of a [place]. She will consider.  Since her bleeding is significantly better, I will plan to d/c home with  oral TXA while planning for add on surgery next week.  Regular carb counting diet now IV Iron infusion ordered today  Appreciate hospitalist involvement to manage blood sugars.   Benjaman Kindler, MD   LOS: 0 days   Benjaman Kindler 05/06/2021, 12:52 PM  Patient ID: Leidy Bodin, female   DOB: Aug 24, 1966, 55 y.o.   MRN: [id number]

## 2021-05-06 NOTE — Progress Notes (Signed)
Patient discharged, discharge instructions and appointments given. patient verbalized understanding.Escorted by Nurse.

## 2021-05-06 NOTE — H&P (Signed)
[place] history and physical  PATIENT NAME: Rhonda Mccall    MR#:  [id number]  DATE OF BIRTH:  27-May-1966  DATE OF ADMISSION:  05/05/2021  PRIMARY CARE PHYSICIAN: Patient, No Pcp Per (Inactive)   Patient is coming from: Home  REQUESTING/REFERRING PHYSICIAN: Drinda Butts, CNM/Dr. Schermerhorn  CHIEF COMPLAINT:   Chief Complaint  Patient presents with  . Hyperglycemia  -Vaginal bleeding  HISTORY OF PRESENT ILLNESS:  Rhonda Mccall is a 55 y.o. [id number] African-American female with medical history significant for type 2 diabetes mellitus, hypertension and dyslipidemia who has been having recent menorrhagia for which she had D&C on Monday 05/02/2021 with improvement after the procedure however she was not able to take the prescribed tranexamic acid due to sore throat and inflammation after intubation.  She took the first pill this morning however she had heavy vaginal bleeding which has resumed.  She reports associated abdominal cramps with her symptoms.  She admits to having heavy bleeding starting on Saturday morning and she has noted blood clots.  No fever or chills.  No chest pain or dyspnea or palpitations.  No cough or wheezing hemoptysis.  No dysuria, oliguria or hematuria or flank pain.  No other bleeding diathesis.  She has been having polyuria and polydipsia with elevated blood glucose levels.  ED Course: When she came to the ER blood pressure was 164/97 with heart rate of 101 with otherwise normal vital signs.  Labs revealed mild hyponatremia and hypochloremia and elevated blood glucose of 644 with anion gap of 9 and CO2 28 and creatinine of 1.02.  UA showed more than 500 glucose and more than 50 WBCs and RBCs.  Once antigens and COVID-19 PCR came back negative.  She had a negative urine pregnancy test on 8/22. EKG as reviewed by me : Most recent EKG on 8/22 revealed normal sinus rhythm rate of 100 with primary  progression.  The patient was typed and crossmatch and will be transfused 1 unit of packed red blood cells.  She was also given 2 L bolus of IV lactated [place] and 1 g of IV tranexamic acid.  She is admitted to GYN service.  I was consulted for medical evaluation and management of her nonketotic hyperglycemia. PAST MEDICAL HISTORY:   Past Medical History:  Diagnosis Date  . Diabetes mellitus without complication ([place])   . Hyperlipidemia   . Hypertension     PAST SURGICAL HISTORY:   Past Surgical History:  Procedure Laterality Date  . COLONOSCOPY WITH ESOPHAGOGASTRODUODENOSCOPY (EGD)    . HYSTEROSCOPY WITH D & C N/A 05/02/2021   Procedure: DILATATION AND CURETTAGE /HYSTEROSCOPY;  Surgeon: Benjaman Kindler, MD;  Location: [facility] ORS;  Service: Gynecology;  Laterality: N/A;    SOCIAL HISTORY:   Social History   Tobacco Use  . Smoking status: Never  . Smokeless tobacco: Never  Substance Use Topics  . Alcohol use: No    FAMILY HISTORY:   Family History  Problem Relation Age of Onset  . Diabetes Mother   . Hypertension Mother   . Stroke Mother     DRUG ALLERGIES:   Allergies  Allergen Reactions  . Sunscreens Hives  . Victoza [  Liraglutide] Other (See Comments)    Yeast infections    REVIEW OF SYSTEMS:   ROS As per history of present illness. All pertinent systems were reviewed above. Constitutional, HEENT, cardiovascular, respiratory, GI, GU, musculoskeletal, neuro, psychiatric, endocrine, integumentary and hematologic systems were reviewed and are otherwise negative/unremarkable except for positive findings mentioned above in the HPI.   MEDICATIONS AT HOME:   Prior to Admission medications   Medication Sig Start Date End Date Taking? Authorizing Provider  amLODipine (NORVASC) 5 MG tablet Take 5 mg by mouth daily.   Yes [provider]  aspirin EC 81 MG EC tablet Take 1 tablet (81 mg total) by mouth daily. 09/17/18  Yes Wieting, Richard, MD  cholecalciferol  (VITAMIN D3) 25 MCG (1000 UNIT) tablet Take 5,000 Units by mouth daily.   Yes [provider]  dapagliflozin propanediol (FARXIGA) 10 MG TABS tablet Take 10 mg by mouth daily.   Yes [provider]  Dulaglutide 1.5 MG/0.5ML SOPN Inject 1.5 mg into the skin once a week. 04/05/18  Yes [provider]  glimepiride (AMARYL) 4 MG tablet Take 4 mg by mouth daily with breakfast.   Yes [provider]  Insulin Degludec 200 UNIT/ML SOPN Inject 40 Units into the skin daily. 11/29/17  Yes [provider]  losartan (COZAAR) 100 MG tablet Take 100 mg by mouth daily.   Yes [provider]  oxyCODONE (OXY IR/ROXICODONE) 5 MG immediate release tablet Take 1 tablet (5 mg total) by mouth every 4 (four) hours as needed. 05/02/21  Yes Benjaman Kindler, MD  predniSONE (DELTASONE) 10 MG tablet Take 1 tablet by mouth daily. 05/03/21  Yes [provider]  rosuvastatin (CRESTOR) 20 MG tablet Take 1 tablet (20 mg total) by mouth daily. 09/16/18  Yes Wieting, Richard, MD  metFORMIN (GLUCOPHAGE) 500 MG tablet Take 1,000 mg by mouth 2 (two) times daily with a meal.     [provider]      VITAL SIGNS:  Blood pressure (!) 145/92, pulse 73, temperature 98.2 F (36.8 C), temperature source Oral, resp. rate 16, height '5\' 6"'$  (1.676 m), weight (!) 142.4 kg, SpO2 100 %.  PHYSICAL EXAMINATION:  Physical Exam  GENERAL:  55 y.o.-year-old African-American female patient lying in the bed with no acute distress.  EYES: Pupils equal, round, reactive to light and accommodation.  Mild pallor.  No scleral icterus. Extraocular muscles intact.  HEENT: Head atraumatic, normocephalic. Oropharynx and nasopharynx clear.  NECK:  Supple, no jugular venous distention. No thyroid enlargement, no tenderness.  LUNGS: Normal breath sounds bilaterally, no wheezing, rales,rhonchi or crepitation. No use of accessory muscles of respiration.  CARDIOVASCULAR: Regular rate and rhythm, S1,  S2 normal. No murmurs, rubs, or gallops.  ABDOMEN: Soft, nondistended, nontender. Bowel sounds present. No organomegaly or mass.  EXTREMITIES: No pedal edema, cyanosis, or clubbing.  NEUROLOGIC: Cranial nerves II through XII are intact. Muscle strength 5/5 in all extremities. Sensation intact. Gait not checked.  PSYCHIATRIC: The patient is alert and oriented x 3.  Normal affect and good eye contact. SKIN: No obvious rash, lesion, or ulcer.   LABORATORY PANEL:   CBC Recent Labs  Lab 05/05/21 2110  WBC 8.7  HGB 7.8*  HCT 24.7*  PLT 253   ------------------------------------------------------------------------------------------------------------------  Chemistries  Recent Labs  Lab 05/05/21 2110  NA 133*  K 4.8  CL 96*  CO2 28  GLUCOSE 644*  BUN 18  CREATININE 1.02*  CALCIUM 8.9   ------------------------------------------------------------------------------------------------------------------  Cardiac Enzymes No results  for input(s): TROPONINI in the last 168 hours. ------------------------------------------------------------------------------------------------------------------  RADIOLOGY:  No results found.    IMPRESSION AND PLAN:  Active Problems:   Menorrhagia  1.  Menorrhagia with subsequent acute blood loss anemia. - The patient will be admitted to a nonmonitored bed under GYN service. - Serial hemoglobins and hematocrits will be followed. - She was typed and crossmatch will be transfused 1 unit of packed red blood cells. - We will follow posttransfusion H&H - She was given IV tranexamic acid. - We will hold off aspirin  2.  Uncontrolled type 2 diabetes mellitus with nonketotic hyperglycemia. - She will be placed on aggressive coverage with subcutaneous NovoLog along with aggressive IV hydration. - Her blood glucose measures have been improving. - We will continue basal coverage. - We will hold off her metformin and continue her Amaryl and  [place].  3.  Mild hyponatremia and hypochloremia likely hypovolemic. - The patient will be placed on hydration with IV normal saline and will follow BMP.  4.  Essential hypertension. -We will continue p.o. Norvasc and placed on as needed IV labetalol.  5.  Dyslipidemia. - We will continue statin therapy.  DVT prophylaxis: SCDs.  Medical prophylaxis currently contraindicated due to her vaginal bleeding. Code Status: full code. Family Communication:  The plan of care was discussed in details with the patient (and family). I answered all questions. The patient agreed to proceed with the above mentioned plan. Further management will depend upon hospital course. Disposition Plan: Back to previous home environment Consults called: none. All the records are reviewed and case discussed with ED provider.  Status is: Inpatient  Remains inpatient appropriate because:Ongoing diagnostic testing needed not appropriate for outpatient work up, Unsafe d/c plan, IV treatments appropriate due to intensity of illness or inability to take PO, and Inpatient level of care appropriate due to severity of illness  Dispo: The patient is from: Home              Anticipated d/c is to: Home              Patient currently is not medically stable to d/c.   Difficult to place patient No  Thank you Ms. Mackie and Dr. Ouida Sills for allowing [place] to participate in the care of this very pleasant lady.  We will follow the patient along with you.  TOTAL TIME TAKING CARE OF THIS PATIENT: 55 minutes.    Christel Mormon M.D on 05/06/2021 at 4:38 AM  [facility]   From 7 PM-7 AM, contact night-coverage [email or web address]  CC: Primary care physician; Patient, No Pcp Per (Inactive)

## 2021-05-07 LAB — BPAM RBC
Blood Product Expiration Date: 202209212359
Blood Product Expiration Date: 202209272359
ISSUE DATE / TIME: 202208260206
Unit Type and Rh: 6200
Unit Type and Rh: 6200

## 2021-05-07 LAB — TYPE AND SCREEN
ABO/RH(D): A POS
Antibody Screen: NEGATIVE
Unit division: 0
Unit division: 0

## 2021-05-07 LAB — PREPARE RBC (CROSSMATCH)

## 2021-05-11 ENCOUNTER — Inpatient Hospital Stay (HOSPITAL_BASED_OUTPATIENT_CLINIC_OR_DEPARTMENT_OTHER): Payer: Managed Care, Other (non HMO) | Admitting: Obstetrics and Gynecology

## 2021-05-11 ENCOUNTER — Encounter: Payer: Self-pay | Admitting: Obstetrics and Gynecology

## 2021-05-11 VITALS — BP 128/71 | HR 102 | Temp 98.2°F | Ht 66.0 in | Wt 301.9 lb

## 2021-05-11 DIAGNOSIS — E1159 Type 2 diabetes mellitus with other circulatory complications: Secondary | ICD-10-CM | POA: Insufficient documentation

## 2021-05-11 DIAGNOSIS — Z7982 Long term (current) use of aspirin: Secondary | ICD-10-CM | POA: Insufficient documentation

## 2021-05-11 DIAGNOSIS — E669 Obesity, unspecified: Secondary | ICD-10-CM | POA: Insufficient documentation

## 2021-05-11 DIAGNOSIS — D696 Thrombocytopenia, unspecified: Secondary | ICD-10-CM | POA: Insufficient documentation

## 2021-05-11 DIAGNOSIS — E559 Vitamin D deficiency, unspecified: Secondary | ICD-10-CM | POA: Insufficient documentation

## 2021-05-11 DIAGNOSIS — E785 Hyperlipidemia, unspecified: Secondary | ICD-10-CM | POA: Insufficient documentation

## 2021-05-11 DIAGNOSIS — N946 Dysmenorrhea, unspecified: Secondary | ICD-10-CM

## 2021-05-11 DIAGNOSIS — N888 Other specified noninflammatory disorders of cervix uteri: Secondary | ICD-10-CM | POA: Insufficient documentation

## 2021-05-11 DIAGNOSIS — Z79899 Other long term (current) drug therapy: Secondary | ICD-10-CM | POA: Insufficient documentation

## 2021-05-11 DIAGNOSIS — Z794 Long term (current) use of insulin: Secondary | ICD-10-CM | POA: Insufficient documentation

## 2021-05-11 DIAGNOSIS — D259 Leiomyoma of uterus, unspecified: Secondary | ICD-10-CM

## 2021-05-11 DIAGNOSIS — D251 Intramural leiomyoma of uterus: Secondary | ICD-10-CM

## 2021-05-11 DIAGNOSIS — D500 Iron deficiency anemia secondary to blood loss (chronic): Secondary | ICD-10-CM | POA: Insufficient documentation

## 2021-05-11 DIAGNOSIS — D250 Submucous leiomyoma of uterus: Secondary | ICD-10-CM

## 2021-05-11 NOTE — Progress Notes (Signed)
Gynecologic Oncology Consult Visit   Referring Provider: Dr. Leafy Ro  Chief Complaint: AUB  Subjective:  Rhonda Mccall is a 55 y.o. female who is seen in consultation from Dr. Leafy Ro for heavy menstrual periods.   Patient presented to Dr. Leafy Ro for heavy menstrual bleeding on 05/02/21. She has a history of heavy menses but symptoms had worsened. History of uterine fibroids. Office TVUS with Dr. Leafy Ro showed uterus had grown from 11 cm in 09/2019 to 19 cm.   She underwent D&C   DIAGNOSIS: A. ENDOCERVIX; CURETTAGE: - BENIGN SQUAMOUS AND ENDOCERVICAL EPITHELIUM. - NEGATIVE FOR DYSPLASIA AND MALIGNANCY.   B. ENDOMETRIUM; CURETTAGE: - SCANT BENIGN ENDOMETRIAL TISSUE, NEGATIVE FOR ATYPIA / EIN AND MALIGNANCY. - BENIGN SQUAMOUS AND ENDOCERVICAL MUCOSA, NEGATIVE FOR DYSPLASIA AND MALIGNANCY.  She was admitted to hospital for acute blood loss anemia on 05/06/21. She received 1 unit pRBCs and 200 mg venofer. Received IV Lysteda. She was discharged home on 05/06/21. She has not started oral iron. Bleeding has improved to 2 pads per day. Previously 4 per day. She is interested in surgery and specifically a robotic approach so she will not have as long as a postoperative recovery.    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[place]) 09/16/2018   Type 2 diabetes mellitus with hyperglycemia, with long-term current use of insulin ([place]) 06/25/2014   Vitamin D deficiency 06/25/2014    Past Medical History: Past Medical History:  Diagnosis Date   Diabetes mellitus without complication ([place])    Hyperlipidemia    Hypertension     Past Surgical History: Past  Surgical History:  Procedure Laterality Date   COLONOSCOPY WITH ESOPHAGOGASTRODUODENOSCOPY (EGD)     HYSTEROSCOPY WITH D & C N/A 05/02/2021   Procedure: DILATATION AND CURETTAGE /HYSTEROSCOPY;  Surgeon: Benjaman Kindler, MD;  Location: [facility] ORS;  Service: Gynecology;  Laterality: N/A;    Past Gynecologic History: as per HPI.  Last Pap 2021 per patient normal.  Abnormal Pap Yes 10 years ago - no treatment/procedures needed.   OB History:  OB History  Gravida Para Term Preterm AB Living  4       4    SAB IAB Ectopic Multiple Live Births  1 3          # Outcome Date GA Lbr Len/2nd Weight Sex Delivery Anes PTL Lv  4 IAB           3 IAB           2 IAB           1 SAB             Family History: Family History  Problem Relation Age of Onset   Diabetes Mother    Hypertension Mother    Stroke Mother     Social History: Social History   Socioeconomic History   Marital status: Single    Spouse name: Not on file   Number of children: Not on file   Years of education: Not on file   Highest education level: Not on file  Occupational History   Not on file  Tobacco Use   Smoking status: Never   Smokeless tobacco: Never  Substance and Sexual Activity   Alcohol use: No   Drug use: Never   Sexual activity: Not on file  Other Topics Concern   Not on file  Social History Narrative   Not on file   Social Determinants of Health   Financial Resource Strain: Not on file  Food Insecurity: Not on file  Transportation Needs: Not on file  Physical Activity: Not on file  Stress: Not on file  Social Connections: Not on file  Intimate Partner Violence: Not on file    Allergies: Allergies  Allergen Reactions   Metformin And Related Diarrhea   Sunscreens Hives   Victoza [Liraglutide] Other (See Comments)    Yeast infections    Current Medications: Current Outpatient Medications  Medication Sig Dispense Refill   amLODipine (NORVASC) 5 MG tablet Take 5 mg by mouth daily.      aspirin EC 81 MG EC tablet Take 1 tablet (81 mg total) by mouth daily. 30 tablet 0   cholecalciferol (VITAMIN D3) 25 MCG (1000 UNIT) tablet Take 5,000 Units by mouth daily.     dapagliflozin propanediol (FARXIGA) 10 MG TABS tablet Take 10 mg by mouth daily.     Dulaglutide 1.5 MG/0.5ML SOPN Inject 1.5 mg into the skin once a week.     glimepiride (AMARYL) 4 MG tablet Take 4 mg by mouth daily with breakfast.     Insulin Degludec 200 UNIT/ML SOPN Inject 40 Units into the skin daily.     losartan (COZAAR) 100 MG tablet Take 100 mg by mouth daily.     oxyCODONE (OXY IR/ROXICODONE) 5 MG immediate release tablet Take 1 tablet (5 mg total) by mouth every 4 (four) hours as needed. 6 tablet 0   predniSONE (DELTASONE) 10 MG tablet Take 1 tablet by mouth daily.     rosuvastatin (CRESTOR) 20 MG tablet Take 1 tablet (20 mg total) by mouth daily. 30 tablet 0   tranexamic acid (LYSTEDA) 650 MG TABS tablet Take 650 mg by mouth 3 (three) times daily.     No current facility-administered medications for this visit.   Review of Systems General: negative for fevers, changes in weight or night sweats Skin: negative for changes in moles or sores or rash Eyes: negative for changes in vision HEENT: negative for change in hearing, tinnitus, voice changes Pulmonary: negative for dyspnea, orthopnea, productive cough, wheezing Cardiac: negative for palpitations, pain Gastrointestinal: negative for nausea, vomiting, constipation, diarrhea, hematemesis, hematochezia Genitourinary/Sexual: negative for dysuria, retention, hematuria, incontinence Ob/Gyn:  negative for abnormal bleeding, or pain Musculoskeletal: negative for pain, joint pain, back pain Hematology: negative for easy bruising, abnormal bleeding Neurologic/Psych: negative for headaches, seizures, paralysis, weakness, numbness  Objective:  Physical Examination:  BP 128/71 (BP Location: Left Arm, Patient Position: Sitting)   Pulse (!) 102   Temp 98.2 F  (36.8 C)   Ht '5\' 6"'$  (1.676 m)   Wt (!) 301 lb 14.4 oz (136.9 kg)   BMI 48.73 kg/m     ECOG Performance Status: 1 - Symptomatic but completely ambulatory  General appearance: alert, cooperative, and appears stated age HEENT: sclera clear Neck: no thyroid enlargement or cervical adenopathy Lymph node survey: non-palpable, axillary, inguinal, supraclavicular Cardiovascular: rgular rate and rhythm  Respiratory: clear to auscultation Abdomen: soft, nontender, nondistended. Uterus is not palpable. No ascites/masses.  Back: inspection of back is normal Extremities: bilateral lower extremity edema 1+ Skin exam - normal coloration and turgor, no rashes, no suspicious skin lesions noted. Neurological exam reveals alert, oriented, normal  speech, no focal findings or movement disorder noted.  Pelvic: EGBUS: no lesions Cervix: no lesions, nontender, mobile, blood from os but no purulent discharge Vagina: no lesions, no discharge. + bleeding Uterus: can not determine size due to habitus, nontender, able to palpate in the LLQ and uterus seems mobile Adnexa: no palpable masses Rectovaginal: deferred  Lab Review Labs on site today: none  Lab Results  Component Value Date   WBC 9.8 05/06/2021   HGB 8.5 (L) 05/06/2021   HCT 26.3 (L) 05/06/2021   MCV 83.2 05/06/2021   PLT 143 (L) 05/06/2021     Radiologic Imaging: 09/24/2019 CLINICAL DATA:  BILATERAL lower abdominal and suprapubic pain since 09/06/2019, abnormal uterine bleeding   EXAM: TRANSABDOMINAL AND TRANSVAGINAL ULTRASOUND OF PELVIS   TECHNIQUE: Both transabdominal and transvaginal ultrasound examinations of the pelvis were performed. Transabdominal technique was performed for global imaging of the pelvis including uterus, ovaries, adnexal regions, and pelvic cul-de-sac. It was necessary to proceed with endovaginal exam following the transabdominal exam to visualize the endometrium and ovaries   COMPARISON:  None    FINDINGS: Uterus   Measurements: 11.6 x 6.2 x 4.7 cm = volume: 177 mL. Multiple uterine nodules are identified consistent with multiple leiomyomata. A largest of these include a posterior RIGHT fundal 7.2 x 6.9 x 5.9 cm subserosal leiomyoma, 6.3 x 3.9 x 5.5 cm fundal leiomyoma, and a 6.2 x 4.5 x 7.5 cm posterior leiomyoma. Several of the leiomyomata appear to extend submucosal.   Endometrium   Thickness: 10 mm.  No endometrial fluid   Right ovary   Measurements: 2.5 x 1.6 x 1.6 cm = volume: 3.4 mL. Normal morphology without mass   Left ovary   Measurements: 3.1 x 2.8 x 2.9 cm = volume: 13.1 mL. Normal morphology without mass   Other findings   No free pelvic fluid. No adnexal masses. Multiple nabothian cysts at cervix.   IMPRESSION: Enlarged uterus containing multiple uterine leiomyomata, some of which extend submucosal.   Unremarkable endometrial complex and ovaries.     Electronically Signed   By: Lavonia Dana M.D.   On: 09/24/2019 12:56    Assessment:  Rhonda Mccall is a 55 y.o. female diagnosed with symptomatic enlarging uterine leiomyoma measuring 19 cm.   Anemia, most likely from abnormal uterine bleeding.   Mild thrombocytopenia.   Medical co-morbidities complicating care: HTN, Diabetes Plan:   Problem List Items Addressed This Visit       Genitourinary   Dysmenorrhea - Primary   Uterine fibroid   We discussed options for management options in terms of surgical approach including robotic verus laparotomy. It is reasonable to attempt robotic approach given less recovery time. I agree with Dr. Leafy Ro that robotic approach is reasonable. I defer to Dr. Leafy Ro regarding ovarian/tubal resection.   We discussed the importance of taking her iron supplements and recommended BID. Discussed that iron can cause constipation.   Recommend ureteral stents.     The patient's diagnosis, an outline of the further diagnostic and laboratory studies which will be  required, the recommendation for surgery, and alternatives were discussed with her and her accompanying family members.  All questions were answered to their satisfaction.  A total of at least 40 minutes were spent with the patient/family today; >50% was spent in education, counseling and coordination of care for symptomatic leiomyoma  I personally had a face to face interaction and evaluated the patient jointly with the NP, Ms. Beckey Rutter.  I have reviewed her  history and available records and have performed the key portions of the physical exam including lymph node survey, abdominal exam, pelvic exam with my findings confirming those documented above by the APP.  I have discussed the case with the APP and the patient.  I agree with the above documentation, assessment and plan which was fully formulated by me.  Counseling was completed by me.   I personally saw the patient and performed a substantive portion of this encounter in conjunction with the listed APP as documented above.  Rhonda Grieser Gaetana Michaelis, MD     CC:  Dr. Leafy Ro

## 2021-05-12 DIAGNOSIS — I749 Embolism and thrombosis of unspecified artery: Secondary | ICD-10-CM

## 2021-05-12 HISTORY — DX: Embolism and thrombosis of unspecified artery: I74.9

## 2021-05-13 ENCOUNTER — Other Ambulatory Visit: Payer: Self-pay | Admitting: Obstetrics and Gynecology

## 2021-05-13 ENCOUNTER — Inpatient Hospital Stay
Admission: RE | Admit: 2021-05-13 | Discharge: 2021-05-13 | Disposition: A | Discharge status: Home / Self Care | Payer: Managed Care, Other (non HMO) | Source: Ambulatory Visit | Attending: Obstetrics and Gynecology | Admitting: Obstetrics and Gynecology

## 2021-05-13 ENCOUNTER — Other Ambulatory Visit: Payer: Self-pay

## 2021-05-13 DIAGNOSIS — M79662 Pain in left lower leg: Secondary | ICD-10-CM

## 2021-05-13 DIAGNOSIS — I82452 Acute embolism and thrombosis of left peroneal vein: Secondary | ICD-10-CM | POA: Insufficient documentation

## 2021-05-13 DIAGNOSIS — I2699 Other pulmonary embolism without acute cor pulmonale: Secondary | ICD-10-CM | POA: Insufficient documentation

## 2021-05-13 DIAGNOSIS — R000 Tachycardia, unspecified: Secondary | ICD-10-CM | POA: Insufficient documentation

## 2021-05-13 MED ORDER — ONDANSETRON HCL 4 MG/2ML IJ SOLN
4.0000 mg | Freq: Four times a day (QID) | INTRAMUSCULAR | Status: DC | PRN
Start: 2021-05-13 — End: 2021-05-14

## 2021-05-13 MED ORDER — SODIUM CHLORIDE 0.9 % IV SOLN: INTRAVENOUS | Status: DC

## 2021-05-13 MED ORDER — INSULIN ASPART 100 UNIT/ML IJ SOLN: 0.0000 [IU] | Freq: Three times a day (TID) | INTRAMUSCULAR | Status: DC

## 2021-05-13 MED ORDER — INSULIN ASPART 100 UNIT/ML IJ SOLN: 0.0000 [IU] | Freq: Every day | INTRAMUSCULAR | Status: DC

## 2021-05-13 MED ORDER — ONDANSETRON HCL 4 MG PO TABS
4.0000 mg | ORAL_TABLET | Freq: Four times a day (QID) | ORAL | Status: DC | PRN
Start: 2021-05-13 — End: 2021-05-14

## 2021-05-13 MED ORDER — ALBUTEROL SULFATE (2.5 MG/3ML) 0.083% IN NEBU: 2.5000 mg | INHALATION_SOLUTION | RESPIRATORY_TRACT | Status: DC | PRN

## 2021-05-13 MED ORDER — IOHEXOL 350 MG/ML SOLN
75.0000 mL | Freq: Once | INTRAVENOUS | Status: AC | PRN
  Administered 2021-05-13: 75 mL via INTRAVENOUS

## 2021-05-13 MED ORDER — INSULIN DEGLUDEC 200 UNIT/ML ~~LOC~~ SOPN: PEN_INJECTOR | Freq: Every day | SUBCUTANEOUS | Status: DC

## 2021-05-13 MED ORDER — SODIUM CHLORIDE 0.9% FLUSH
3.0000 mL | Freq: Two times a day (BID) | INTRAVENOUS | Status: DC
Start: 2021-05-13 — End: 2021-05-14

## 2021-05-13 NOTE — Progress Notes (Signed)
Pt from clinic with fatigue with exertion and tachycardia, on po TXA x6 days and pain in left calf. Hx of profound menorrhagia with acute blood loss anemia to low of hgb 7.8, recent blood transfusion during hospitalization last weekend for significantly elevated blood sugars to high of 644 after oral steroid course but no evidence of DKA.  After discussion with vascular surgery about plans for hysterectomy in near future to control uterine bleeding, we are considering IVC filter for LLE DVT, but will workup for acute PE now.

## 2021-05-14 ENCOUNTER — Inpatient Hospital Stay
Admission: AD | Admit: 2021-05-14 | Discharge: 2021-05-15 | DRG: 176 | Disposition: A | Discharge status: Home / Self Care | Payer: Managed Care, Other (non HMO) | Source: Ambulatory Visit | Attending: Internal Medicine | Admitting: Internal Medicine

## 2021-05-14 ENCOUNTER — Encounter: Payer: Self-pay | Admitting: Internal Medicine

## 2021-05-14 DIAGNOSIS — D6489 Other specified anemias: Secondary | ICD-10-CM | POA: Diagnosis present

## 2021-05-14 DIAGNOSIS — N946 Dysmenorrhea, unspecified: Secondary | ICD-10-CM | POA: Diagnosis present

## 2021-05-14 DIAGNOSIS — Z7984 Long term (current) use of oral hypoglycemic drugs: Secondary | ICD-10-CM | POA: Diagnosis not present

## 2021-05-14 DIAGNOSIS — D649 Anemia, unspecified: Secondary | ICD-10-CM

## 2021-05-14 DIAGNOSIS — I82402 Acute embolism and thrombosis of unspecified deep veins of left lower extremity: Secondary | ICD-10-CM | POA: Diagnosis not present

## 2021-05-14 DIAGNOSIS — D6959 Other secondary thrombocytopenia: Secondary | ICD-10-CM | POA: Diagnosis present

## 2021-05-14 DIAGNOSIS — Z833 Family history of diabetes mellitus: Secondary | ICD-10-CM

## 2021-05-14 DIAGNOSIS — D259 Leiomyoma of uterus, unspecified: Secondary | ICD-10-CM | POA: Diagnosis present

## 2021-05-14 DIAGNOSIS — Z8249 Family history of ischemic heart disease and other diseases of the circulatory system: Secondary | ICD-10-CM

## 2021-05-14 DIAGNOSIS — E785 Hyperlipidemia, unspecified: Secondary | ICD-10-CM | POA: Diagnosis present

## 2021-05-14 DIAGNOSIS — I2693 Single subsegmental pulmonary embolism without acute cor pulmonale: Secondary | ICD-10-CM | POA: Diagnosis present

## 2021-05-14 DIAGNOSIS — Z794 Long term (current) use of insulin: Secondary | ICD-10-CM | POA: Diagnosis not present

## 2021-05-14 DIAGNOSIS — Z8673 Personal history of transient ischemic attack (TIA), and cerebral infarction without residual deficits: Secondary | ICD-10-CM

## 2021-05-14 DIAGNOSIS — Z20822 Contact with and (suspected) exposure to covid-19: Secondary | ICD-10-CM | POA: Diagnosis present

## 2021-05-14 DIAGNOSIS — I10 Essential (primary) hypertension: Secondary | ICD-10-CM | POA: Diagnosis present

## 2021-05-14 DIAGNOSIS — I4581 Long QT syndrome: Secondary | ICD-10-CM | POA: Diagnosis not present

## 2021-05-14 DIAGNOSIS — I2699 Other pulmonary embolism without acute cor pulmonale: Secondary | ICD-10-CM | POA: Diagnosis present

## 2021-05-14 DIAGNOSIS — E559 Vitamin D deficiency, unspecified: Secondary | ICD-10-CM | POA: Diagnosis present

## 2021-05-14 DIAGNOSIS — N920 Excessive and frequent menstruation with regular cycle: Secondary | ICD-10-CM | POA: Diagnosis present

## 2021-05-14 DIAGNOSIS — D500 Iron deficiency anemia secondary to blood loss (chronic): Secondary | ICD-10-CM | POA: Diagnosis not present

## 2021-05-14 DIAGNOSIS — I82452 Acute embolism and thrombosis of left peroneal vein: Secondary | ICD-10-CM | POA: Diagnosis present

## 2021-05-14 DIAGNOSIS — Z7982 Long term (current) use of aspirin: Secondary | ICD-10-CM

## 2021-05-14 DIAGNOSIS — E119 Type 2 diabetes mellitus without complications: Secondary | ICD-10-CM | POA: Diagnosis present

## 2021-05-14 DIAGNOSIS — E876 Hypokalemia: Secondary | ICD-10-CM | POA: Diagnosis present

## 2021-05-14 DIAGNOSIS — Z79899 Other long term (current) drug therapy: Secondary | ICD-10-CM

## 2021-05-14 DIAGNOSIS — Z6841 Body Mass Index (BMI) 40.0 and over, adult: Secondary | ICD-10-CM | POA: Diagnosis not present

## 2021-05-14 LAB — CBC WITH DIFFERENTIAL/PLATELET
Abs Immature Granulocytes: 0.02 K/uL (ref 0.00–0.07)
Basophils Absolute: 0 K/uL (ref 0.0–0.1)
Basophils Relative: 0 %
Eosinophils Absolute: 0.1 K/uL (ref 0.0–0.5)
Eosinophils Relative: 1 %
HCT: 27.8 % — ABNORMAL LOW (ref 36.0–46.0)
Hemoglobin: 8.8 g/dL — ABNORMAL LOW (ref 12.0–15.0)
Immature Granulocytes: 0 %
Lymphocytes Relative: 27 %
Lymphs Abs: 1.8 K/uL (ref 0.7–4.0)
MCH: 26.2 pg (ref 26.0–34.0)
MCHC: 31.7 g/dL (ref 30.0–36.0)
MCV: 82.7 fL (ref 80.0–100.0)
Monocytes Absolute: 0.4 K/uL (ref 0.1–1.0)
Monocytes Relative: 6 %
Neutro Abs: 4.4 K/uL (ref 1.7–7.7)
Neutrophils Relative %: 66 %
Platelets: 322 K/uL (ref 150–400)
RBC: 3.36 MIL/uL — ABNORMAL LOW (ref 3.87–5.11)
RDW: 17.6 % — ABNORMAL HIGH (ref 11.5–15.5)
WBC: 6.7 K/uL (ref 4.0–10.5)
nRBC: 0 % (ref 0.0–0.2)

## 2021-05-14 LAB — URINALYSIS, ROUTINE W REFLEX MICROSCOPIC
Bacteria, UA: NONE SEEN
Glucose, UA: NEGATIVE mg/dL
Ketones, ur: NEGATIVE mg/dL
Leukocytes,Ua: NEGATIVE
Nitrite: NEGATIVE
Protein, ur: 100 mg/dL — AB
RBC / HPF: >50 RBC/hpf — ABNORMAL HIGH (ref 0–5)
Specific Gravity, Urine: 1.02 (ref 1.005–1.030)
WBC, UA: >50 WBC/hpf — ABNORMAL HIGH (ref 0–5)
pH: 5.5 (ref 5.0–8.0)

## 2021-05-14 LAB — PROTIME-INR
INR: 1.1 (ref 0.8–1.2)
Prothrombin Time: 13.7 seconds (ref 11.4–15.2)

## 2021-05-14 LAB — MAGNESIUM: Magnesium: 2.4 mg/dL (ref 1.7–2.4)

## 2021-05-14 LAB — GLUCOSE, CAPILLARY
Glucose-Capillary: 119 mg/dL — ABNORMAL HIGH (ref 70–99)
Glucose-Capillary: 131 mg/dL — ABNORMAL HIGH (ref 70–99)
Glucose-Capillary: 99 mg/dL (ref 70–99)
Glucose-Capillary: 77 mg/dL (ref 70–99)

## 2021-05-14 LAB — COMPREHENSIVE METABOLIC PANEL
ALT: 15 U/L (ref 0–44)
AST: 17 U/L (ref 15–41)
Albumin: 3.3 g/dL — ABNORMAL LOW (ref 3.5–5.0)
Alkaline Phosphatase: 43 U/L (ref 38–126)
Anion gap: 7 (ref 5–15)
BUN: 9 mg/dL (ref 6–20)
CO2: 28 mmol/L (ref 22–32)
Calcium: 8.7 mg/dL — ABNORMAL LOW (ref 8.9–10.3)
Chloride: 103 mmol/L (ref 98–111)
Creatinine, Ser: 0.84 mg/dL (ref 0.44–1.00)
GFR, Estimated: >60 mL/min (ref >60)
Glucose, Bld: 172 mg/dL — ABNORMAL HIGH (ref 70–99)
Potassium: 3.3 mmol/L — ABNORMAL LOW (ref 3.5–5.1)
Sodium: 138 mmol/L (ref 135–145)
Total Bilirubin: 0.7 mg/dL (ref 0.3–1.2)
Total Protein: 6.4 g/dL — ABNORMAL LOW (ref 6.5–8.1)

## 2021-05-14 LAB — BRAIN NATRIURETIC PEPTIDE: B Natriuretic Peptide: 3.6 pg/mL (ref 0.0–100.0)

## 2021-05-14 LAB — HIV ANTIBODY (ROUTINE TESTING W REFLEX): HIV Screen 4th Generation wRfx: NONREACTIVE

## 2021-05-14 LAB — HEPARIN LEVEL (UNFRACTIONATED)
Heparin Unfractionated: 0.56 IU/mL (ref 0.30–0.70)
Heparin Unfractionated: 0.31 IU/mL (ref 0.30–0.70)

## 2021-05-14 LAB — TROPONIN I (HIGH SENSITIVITY): Troponin I (High Sensitivity): 3 ng/L (ref <18)

## 2021-05-14 LAB — APTT: aPTT: 31 seconds (ref 24–36)

## 2021-05-14 LAB — SARS CORONAVIRUS 2 (TAT 6-24 HRS): SARS Coronavirus 2: NEGATIVE

## 2021-05-14 LAB — TSH: TSH: 3.269 u[IU]/mL (ref 0.350–4.500)

## 2021-05-14 MED ORDER — SODIUM CHLORIDE 0.9% FLUSH: 10.0000 mL | INTRAVENOUS | Status: DC | PRN

## 2021-05-14 MED ORDER — DULAGLUTIDE 1.5 MG/0.5ML ~~LOC~~ SOAJ: 1.5000 mg | SUBCUTANEOUS | Status: DC

## 2021-05-14 MED ORDER — SODIUM CHLORIDE 0.9 % IV SOLN: INTRAVENOUS | Status: DC

## 2021-05-14 MED ORDER — SODIUM CHLORIDE 0.9% FLUSH
3.0000 mL | Freq: Two times a day (BID) | INTRAVENOUS | Status: DC
  Administered 2021-05-14: 3 mL via INTRAVENOUS

## 2021-05-14 MED ORDER — AMLODIPINE BESYLATE 5 MG PO TABS
5.0000 mg | ORAL_TABLET | Freq: Every day | ORAL | Status: DC
  Administered 2021-05-14 – 2021-05-15 (×2): 5 mg via ORAL
  Filled 2021-05-14 (×2): qty 1

## 2021-05-14 MED ORDER — ONDANSETRON HCL 4 MG/2ML IJ SOLN: 4.0000 mg | Freq: Four times a day (QID) | INTRAMUSCULAR | Status: DC | PRN

## 2021-05-14 MED ORDER — HEPARIN BOLUS VIA INFUSION
5000.0000 [IU] | Freq: Once | INTRAVENOUS | Status: AC
  Administered 2021-05-14: 5000 [IU] via INTRAVENOUS
  Filled 2021-05-14: qty 5000

## 2021-05-14 MED ORDER — INSULIN DEGLUDEC 200 UNIT/ML ~~LOC~~ SOPN: PEN_INJECTOR | Freq: Every day | SUBCUTANEOUS | Status: DC

## 2021-05-14 MED ORDER — ROSUVASTATIN CALCIUM 10 MG PO TABS
20.0000 mg | ORAL_TABLET | Freq: Every day | ORAL | Status: DC
  Administered 2021-05-14 – 2021-05-15 (×2): 20 mg via ORAL
  Filled 2021-05-14 (×2): qty 2

## 2021-05-14 MED ORDER — INSULIN ASPART 100 UNIT/ML IJ SOLN
0.0000 [IU] | Freq: Three times a day (TID) | INTRAMUSCULAR | Status: DC
  Administered 2021-05-14: 2 [IU] via SUBCUTANEOUS
  Filled 2021-05-14: qty 1

## 2021-05-14 MED ORDER — DAPAGLIFLOZIN PROPANEDIOL 5 MG PO TABS
10.0000 mg | ORAL_TABLET | Freq: Every day | ORAL | Status: DC
  Administered 2021-05-14 – 2021-05-15 (×2): 10 mg via ORAL
  Filled 2021-05-14 (×2): qty 2

## 2021-05-14 MED ORDER — HEPARIN BOLUS VIA INFUSION: 5000.0000 [IU] | Freq: Once | INTRAVENOUS | Status: DC

## 2021-05-14 MED ORDER — POTASSIUM CHLORIDE CRYS ER 20 MEQ PO TBCR
40.0000 meq | EXTENDED_RELEASE_TABLET | Freq: Once | ORAL | Status: AC
  Administered 2021-05-14: 40 meq via ORAL
  Filled 2021-05-14: qty 2

## 2021-05-14 MED ORDER — INSULIN GLARGINE-YFGN 100 UNIT/ML ~~LOC~~ SOLN
40.0000 [IU] | Freq: Every day | SUBCUTANEOUS | Status: DC
  Administered 2021-05-14 – 2021-05-15 (×2): 40 [IU] via SUBCUTANEOUS
  Filled 2021-05-14 (×3): qty 0.4

## 2021-05-14 MED ORDER — ONDANSETRON HCL 4 MG PO TABS: 4.0000 mg | ORAL_TABLET | Freq: Four times a day (QID) | ORAL | Status: DC | PRN

## 2021-05-14 MED ORDER — ALBUTEROL SULFATE (2.5 MG/3ML) 0.083% IN NEBU: 2.5000 mg | INHALATION_SOLUTION | RESPIRATORY_TRACT | Status: DC | PRN

## 2021-05-14 MED ORDER — HEPARIN (PORCINE) 25000 UT/250ML-% IV SOLN
1450.0000 [IU]/h | INTRAVENOUS | Status: DC
  Administered 2021-05-14 (×2): 1600 [IU]/h via INTRAVENOUS
  Filled 2021-05-14 (×2): qty 250

## 2021-05-14 MED ORDER — SODIUM CHLORIDE 0.9% FLUSH
10.0000 mL | Freq: Two times a day (BID) | INTRAVENOUS | Status: DC
  Administered 2021-05-14: 10 mL

## 2021-05-14 MED ORDER — HEPARIN (PORCINE) 25000 UT/250ML-% IV SOLN: 1600.0000 [IU]/h | INTRAVENOUS | Status: DC

## 2021-05-14 NOTE — H&P (Signed)
History and Physical    Rhonda Mccall [id number] DOB: 10/21/1965 DOA: 05/14/2021  PCP: Patient, No Pcp Per (Inactive)  Patient coming from: home/gyn office  I have personally briefly reviewed patient's old medical records in [place]  Chief Complaint:  dvt/pe  HPI: Rhonda Mccall is a 56 y.o. female with medical history significant of  Patient is a 55 y/o with   diabetes mellitus, hypertension and dyslipidemia interim hx of  recent menorrhagia for which she had D&C on Monday 05/02/2021, prbc found to have large fibroid with plans of lap TAH, patient of note during that time has complication of throat inflammation requiring use of steroids with was complicated by return to ed with hyperglycemia. Patient stabilized and at f/u with OB/GYN re up coming surgery , she was noted to have increase doe and calf pain on evaluation found to have dvt and subsegmental DVT , patient is admitted for iv heparin and monitoring for recurrent bleeding due to her noted history.  Patient is being directly admitted from gyn clinic. Of note if patient has severe bleeding on heparin will need IV for IVC placement. Patient currently notes that she has no sob at rest, no chest pain no n/v/d/f/c/dysuria. She does mention that she continues to have her menstrual cycle but notes it is not as heavy as it was before.    ED Course:  N/a  Review of Systems: As per HPI otherwise 10 point review of systems negative.   Past Medical History:  Diagnosis Date   Diabetes mellitus without complication ([place])    Hyperlipidemia    Hypertension     Past Surgical History:  Procedure Laterality Date   COLONOSCOPY WITH ESOPHAGOGASTRODUODENOSCOPY (EGD)     HYSTEROSCOPY WITH D & C N/A 05/02/2021   Procedure: DILATATION AND CURETTAGE /HYSTEROSCOPY;  Surgeon: Benjaman Kindler, MD;  Location: [facility] ORS;  Service: Gynecology;  Laterality: N/A;     reports that she has never smoked. She has never used smokeless tobacco. She  reports that she does not drink alcohol and does not use drugs.  Allergies  Allergen Reactions   Metformin And Related Diarrhea   Sunscreens Hives   Victoza [Liraglutide] Other (See Comments)    Yeast infections    Family History  Problem Relation Age of Onset   Diabetes Mother    Hypertension Mother    Stroke Mother    Prior to Admission medications   Medication Sig Start Date End Date Taking? Authorizing Provider  amLODipine (NORVASC) 5 MG tablet Take 5 mg by mouth daily.    [provider]  aspirin EC 81 MG EC tablet Take 1 tablet (81 mg total) by mouth daily. 09/17/18   Loletha Grayer, MD  cholecalciferol (VITAMIN D3) 25 MCG (1000 UNIT) tablet Take 5,000 Units by mouth daily.    [provider]  dapagliflozin propanediol (FARXIGA) 10 MG TABS tablet Take 10 mg by mouth daily.    [provider]  Dulaglutide 1.5 MG/0.5ML SOPN Inject 1.5 mg into the skin once a week. 04/05/18   [provider]  glimepiride (AMARYL) 4 MG tablet Take 4 mg by mouth daily with breakfast.    [provider]  Insulin Degludec 200 UNIT/ML SOPN Inject 40 Units into the skin daily. 11/29/17   [provider]  losartan (COZAAR) 100 MG tablet Take 100 mg by mouth daily.    [provider]  oxyCODONE (OXY IR/ROXICODONE) 5 MG immediate release tablet Take 1 tablet (5 mg total) by mouth  every 4 (four) hours as needed. 05/02/21   Benjaman Kindler, MD  predniSONE (DELTASONE) 10 MG tablet Take 1 tablet by mouth daily. 05/03/21   [provider]  rosuvastatin (CRESTOR) 20 MG tablet Take 1 tablet (20 mg total) by mouth daily. 09/16/18   Loletha Grayer, MD  tranexamic acid (LYSTEDA) 650 MG TABS tablet Take 650 mg by mouth 3 (three) times daily.    [provider]    Physical Exam: Vitals:   05/14/21 0049 05/14/21 0053  BP:  (!) 144/77  Pulse:  (!) 102  Resp:  18  Temp:  98 F (36.7 C)  TempSrc:  Oral  SpO2:  94%  Weight: 135.2 kg    Height: '5\' 6"'$  (1.676 m)      Vitals:   05/14/21 0049 05/14/21 0053  BP:  (!) 144/77  Pulse:  (!) 102  Resp:  18  Temp:  98 F (36.7 C)  TempSrc:  Oral  SpO2:  94%  Weight: 135.2 kg   Height: '5\' 6"'$  (1.676 m)   Constitutional: NAD, calm, comfortable Eyes: PERRL, lids and conjunctivae normal ENMT: Mucous membranes are moist. Posterior pharynx clear of any exudate or lesions.Normal dentition.  Neck: normal, supple, no masses, no thyromegaly Respiratory: clear to auscultation bilaterally, no wheezing, no crackles. Normal respiratory effort. No accessory muscle use.  Cardiovascular: Regular rate and rhythm, no murmurs / rubs / gallops. No extremity edema. 2+ pedal pulses  Abdomen: no tenderness, no masses palpated. No hepatosplenomegaly. Bowel sounds positive.  Musculoskeletal: no clubbing / cyanosis. No joint deformity upper and lower extremities. Good ROM, no contractures. Normal muscle tone.  Skin: no rashes, lesions, ulcers. No induration Neurologic: CN 2-12 grossly intact. Sensation intact, . Strength 5/5 in all 4.  Psychiatric: Normal judgment and insight. Alert and oriented x 3. Normal mood.    Labs on Admission: I have personally reviewed following labs and imaging studies  CBC: Recent Labs  Lab 05/14/21 0250  WBC 6.7  NEUTROABS 4.4  HGB 8.8*  HCT 27.8*  MCV 82.7  PLT [id number]   Basic Metabolic Panel: Recent Labs  Lab 05/14/21 0250  NA 138  K 3.3*  CL 103  CO2 28  GLUCOSE 172*  BUN 9  CREATININE 0.84  CALCIUM 8.7*  MG 2.4   GFR: Estimated Creatinine Clearance: 108.4 mL/min (by C-G formula based on SCr of 0.84 mg/dL). Liver Function Tests: Recent Labs  Lab 05/14/21 0250  AST 17  ALT 15  ALKPHOS 43  BILITOT 0.7  PROT 6.4*  ALBUMIN 3.3*   No results for input(s): LIPASE, AMYLASE in the last 168 hours. No results for input(s): AMMONIA in the last 168 hours. Coagulation Profile: Recent Labs  Lab 05/14/21 0250  INR 1.1   Cardiac Enzymes: No  results for input(s): CKTOTAL, CKMB, CKMBINDEX, TROPONINI in the last 168 hours. BNP (last 3 results) No results for input(s): PROBNP in the last 8760 hours. HbA1C: No results for input(s): HGBA1C in the last 72 hours. CBG: No results for input(s): GLUCAP in the last 168 hours. Lipid Profile: No results for input(s): CHOL, HDL, LDLCALC, TRIG, CHOLHDL, LDLDIRECT in the last 72 hours. Thyroid Function Tests: Recent Labs    05/14/21 0250  TSH 3.269   Anemia Panel: No results for input(s): VITAMINB12, FOLATE, FERRITIN, TIBC, IRON, RETICCTPCT in the last 72 hours. Urine analysis:    Component Value Date/Time   COLORURINE AMBER (A) 05/14/2021 0330   APPEARANCEUR CLOUDY (A) 05/14/2021 0330   LABSPEC 1.020 05/14/2021  0330   PHURINE 5.5 05/14/2021 0330   GLUCOSEU NEGATIVE 05/14/2021 0330   HGBUR LARGE (A) 05/14/2021 0330   BILIRUBINUR SMALL (A) 05/14/2021 0330   KETONESUR NEGATIVE 05/14/2021 0330   PROTEINUR 100 (A) 05/14/2021 0330   NITRITE NEGATIVE 05/14/2021 0330   LEUKOCYTESUR NEGATIVE 05/14/2021 0330    Radiological Exams on Admission: CT Angio Chest Pulmonary Embolism (PE) W or WO Contrast  Result Date: 05/13/2021 CLINICAL DATA:  PE suspected, high prob Dyspnea with exertion, tachycardia, known DVT left calf, on thrombogenic EXAM: CT ANGIOGRAPHY CHEST WITH CONTRAST TECHNIQUE: Multidetector CT imaging of the chest was performed using the standard protocol during bolus administration of intravenous contrast. Multiplanar CT image reconstructions and MIPs were obtained to evaluate the vascular anatomy. CONTRAST:  88m OMNIPAQUE IOHEXOL 350 MG/ML SOLN COMPARISON:  None. FINDINGS: Cardiovascular: Satisfactory opacification of pulmonary arteries. There is a right lower lobe subsegmental pulmonary embolism (series 5, image 191). No evidence of right heart strain. The heart is normal in size. No pericardial disease. Normal size main and branch pulmonary arteries. Thoracic aorta is  unremarkable. Mediastinum/Nodes: There is no mediastinal, hilar, or axillary lymphadenopathy. The thyroid is unremarkable. The trachea is unremarkable. There is a small hiatal hernia. Lungs/Pleura: The airways are patent. There is no focal airspace consolidation. There is no pleural effusion or pneumothorax. Upper Abdomen: No acute abnormality. Musculoskeletal: There is a 2.9 x 2.0 cm right breast mass (series 4, image 42) in the central subareolar breast. There are bilateral breast calcifications. No acute osseous abnormality. No suspicious lytic or blastic lesions. Multilevel degenerative changes of the spine. Review of the MIP images confirms the above findings. IMPRESSION: Acute right lower lobe subsegmental pulmonary embolism. No evidence of right heart strain. No focal airspace disease. 2.9 x 2.0 cm right breast mass in the central subareolar breast. Recommend correlation with mammography. Critical Value/emergent results were called by telephone at the time of interpretation on 05/13/2021 at 7:15 pm to [facility], who verbally acknowledged these results. Electronically Signed   By: JMaurine SimmeringM.D.   On: 05/13/2021 19:17   UK[place]Venous Img Lower Unilateral Left (DVT)  Result Date: 05/13/2021 CLINICAL DATA:  eval: DVT EXAM: LEFT LOWER EXTREMITY VENOUS DOPPLER ULTRASOUND TECHNIQUE: Gray-scale sonography with graded compression, as well as color Doppler and duplex ultrasound were performed to evaluate the lower extremity deep venous systems from the level of the common femoral vein and including the common femoral, femoral, profunda femoral, popliteal and calf veins including the posterior tibial, peroneal and gastrocnemius veins when visible. The superficial great saphenous vein was also interrogated. Spectral Doppler was utilized to evaluate flow at rest and with distal augmentation maneuvers in the common femoral, femoral and popliteal veins. COMPARISON:  None. FINDINGS: Contralateral Common  Femoral Vein: Respiratory phasicity is normal and symmetric with the symptomatic side. No evidence of thrombus. Normal compressibility. Common Femoral Vein: No evidence of thrombus. Normal compressibility, respiratory phasicity and response to augmentation. Saphenofemoral Junction: No evidence of thrombus. Normal compressibility and flow on color Doppler imaging. Profunda Femoral Vein: No evidence of thrombus. Normal compressibility and flow on color Doppler imaging. Femoral Vein: No evidence of thrombus. Normal compressibility, respiratory phasicity and response to augmentation. Popliteal Vein: No evidence of thrombus. Normal compressibility, respiratory phasicity and response to augmentation. Calf Veins: There is occlusive thrombus in 1 of the peroneal veins. The remainder of the deep veins of the calf are patent. Other Findings:  None. IMPRESSION: 1. There appears to be occlusive thrombus limited to  1 of the peroneal veins (deep vein) in the calf. The remainder of the deep veins of the calf and more central left lower extremity deep veins are widely patent. These results will be called to the ordering clinician or representative by the Radiologist Assistant, and communication documented in the PACS or [employer]. Electronically Signed   By: Albin Felling M.D.   On: 05/13/2021 16:28    EKG: Independently reviewed. Nsr no st -twave changes  Assessment/Plan Acute subsegmental  PE  DVT -start on heparin drip  -monitor h/h  - if  patient unable to tolerate heparin due to bleeding will need IR  for IVC placement   Menorrhagia  -S/p d/c 05/02/2021  -stable  -plans for TAH when able  -serial h/h while on heparin drip  DmII -resume oral regimen as able  - continue long acting insulin  -place on fs/iss  HLD -continue statin    DVT prophylaxis: heparin drip Code Status: full Family Communication:  n/a Disposition Plan: patient  expected to be admitted less than 2 midnights   Consults:na Admission status: med tele   Clance Boll MD [facility]   If 7PM-7AM, please contact night-coverage [email or web address] Password [facility]  05/14/2021, 5:41 AM

## 2021-05-14 NOTE — Progress Notes (Addendum)
Patient admitted early this morning detail please refer to H&P   recent hx of menorrhagia s/p D&c on 8/22, sent from gyn office due to acute DVT/PE, on heparin drip, still having menses, monitor hgb D/c home with eliquis vs needing IR place removable IVC filter pending on vaginal bleeding while on anticoagulation She is scheduled to have hysterectomy in the near future .  Midline placed due to poor iv access, heparin drip running   Hypokalemia replace K  Insulin-dependent type 2 diabetes, blood glucose stable on current regimen  Hypertension/hyperlipidemia stable on current regimen  Body mass index is 48.1 kg/m.

## 2021-05-14 NOTE — Progress Notes (Signed)
  Chaplain On-Call responded to Order Requisition for Advance Directives information for the patient.  Chaplain met the patient and provided the documents and education for the patient.  Patient stated her understanding and that she will read and discuss further with her family.  Chaplains are available for additional support as needed.  Chaplain Pollyann Samples M.Div., [facility]

## 2021-05-14 NOTE — Progress Notes (Signed)
Benefits check requested for apixaban and rivaroxaban.  Oleh Genin, [place]

## 2021-05-14 NOTE — Progress Notes (Signed)
ANTICOAGULATION CONSULT NOTE   Pharmacy Consult for Heparin  Indication: pulmonary embolus  Allergies  Allergen Reactions   Metformin And Related Diarrhea   Sunscreens Hives   Victoza [Liraglutide] Other (See Comments)    Yeast infections    Patient Measurements: Height: '5\' 6"'$  (167.6 cm) Weight: 135.2 kg (298 lb) IBW/kg (Calculated) : 59.3 Heparin Dosing Weight: 92.4 kg   Vital Signs: Temp: 97.7 F (36.5 C) (09/03 1500) Temp Source: Oral (09/03 1500) BP: 134/89 (09/03 1500) Pulse Rate: 87 (09/03 1500)  Labs: Recent Labs    05/14/21 0250 05/14/21 0918 05/14/21 1454  HGB 8.8*  --   --   HCT 27.8*  --   --   PLT 322  --   --   APTT 31  --   --   LABPROT 13.7  --   --   INR 1.1  --   --   HEPARINUNFRC  --  0.56 0.31  CREATININE 0.84  --   --   TROPONINIHS 3  --   --      Estimated Creatinine Clearance: 108.4 mL/min (by C-G formula based on SCr of 0.84 mg/dL).   Medical History: Past Medical History:  Diagnosis Date   Diabetes mellitus without complication ([place])    Hyperlipidemia    Hypertension     Medications:  Medications Prior to Admission  Medication Sig Dispense Refill Last Dose   amLODipine (NORVASC) 5 MG tablet Take 5 mg by mouth daily.      aspirin EC 81 MG EC tablet Take 1 tablet (81 mg total) by mouth daily. 30 tablet 0    cholecalciferol (VITAMIN D3) 25 MCG (1000 UNIT) tablet Take 5,000 Units by mouth daily.      dapagliflozin propanediol (FARXIGA) 10 MG TABS tablet Take 10 mg by mouth daily.      Dulaglutide 1.5 MG/0.5ML SOPN Inject 1.5 mg into the skin once a week.      glimepiride (AMARYL) 4 MG tablet Take 4 mg by mouth daily with breakfast.      Insulin Degludec 200 UNIT/ML SOPN Inject 40 Units into the skin daily.      losartan (COZAAR) 100 MG tablet Take 100 mg by mouth daily.      oxyCODONE (OXY IR/ROXICODONE) 5 MG immediate release tablet Take 1 tablet (5 mg total) by mouth every 4 (four) hours as needed. 6 tablet 0    predniSONE  (DELTASONE) 10 MG tablet Take 1 tablet by mouth daily.      rosuvastatin (CRESTOR) 20 MG tablet Take 1 tablet (20 mg total) by mouth daily. 30 tablet 0    tranexamic acid (LYSTEDA) 650 MG TABS tablet Take 650 mg by mouth 3 (three) times daily.       Assessment: Pharmacy consulted to dose heparin in this 55 year old female admitted with PE.  CBC stable.  No prior anticoag noted.   9/3 @ 0918 HL 0.56 (therapeutic) 9/3 @ 1532 HL 0.31 (therapeutic)  Goal of Therapy:  Heparin level 0.3-0.7 units/ml Monitor platelets by anticoagulation protocol: Yes   Plan:  Heparin level is therapeutic. Will continue heparin infusion at 1600 units/hr. Recheck heparin level with AM labs. CBC daily while on heparin. F/u with transition to PO AC.     Metzli Pollick Rodriguez-Guzman PharmD, BCPS 05/14/2021 4:01 PM

## 2021-05-14 NOTE — Progress Notes (Signed)
ANTICOAGULATION CONSULT NOTE - Initial Consult  Pharmacy Consult for Heparin  Indication: pulmonary embolus  Allergies  Allergen Reactions   Metformin And Related Diarrhea   Sunscreens Hives   Victoza [Liraglutide] Other (See Comments)    Yeast infections    Patient Measurements: Height: '5\' 6"'$  (167.6 cm) Weight: 135.2 kg (298 lb) IBW/kg (Calculated) : 59.3 Heparin Dosing Weight: 92.4 kg   Vital Signs: Temp: 98 F (36.7 C) (09/03 0053) Temp Source: Oral (09/03 0053) BP: 144/77 (09/03 0053) Pulse Rate: 102 (09/03 0053)  Labs: No results for input(s): HGB, HCT, PLT, APTT, LABPROT, INR, HEPARINUNFRC, HEPRLOWMOCWT, CREATININE, CKTOTAL, CKMB, TROPONINIHS in the last 72 hours.  Estimated Creatinine Clearance: 89.3 mL/min (A) (by C-G formula based on SCr of 1.02 mg/dL (H)).   Medical History: Past Medical History:  Diagnosis Date   Diabetes mellitus without complication ([place])    Hyperlipidemia    Hypertension     Medications:  Medications Prior to Admission  Medication Sig Dispense Refill Last Dose   amLODipine (NORVASC) 5 MG tablet Take 5 mg by mouth daily.      aspirin EC 81 MG EC tablet Take 1 tablet (81 mg total) by mouth daily. 30 tablet 0    cholecalciferol (VITAMIN D3) 25 MCG (1000 UNIT) tablet Take 5,000 Units by mouth daily.      dapagliflozin propanediol (FARXIGA) 10 MG TABS tablet Take 10 mg by mouth daily.      Dulaglutide 1.5 MG/0.5ML SOPN Inject 1.5 mg into the skin once a week.      glimepiride (AMARYL) 4 MG tablet Take 4 mg by mouth daily with breakfast.      Insulin Degludec 200 UNIT/ML SOPN Inject 40 Units into the skin daily.      losartan (COZAAR) 100 MG tablet Take 100 mg by mouth daily.      oxyCODONE (OXY IR/ROXICODONE) 5 MG immediate release tablet Take 1 tablet (5 mg total) by mouth every 4 (four) hours as needed. 6 tablet 0    predniSONE (DELTASONE) 10 MG tablet Take 1 tablet by mouth daily.      rosuvastatin (CRESTOR) 20 MG tablet Take 1 tablet  (20 mg total) by mouth daily. 30 tablet 0    tranexamic acid (LYSTEDA) 650 MG TABS tablet Take 650 mg by mouth 3 (three) times daily.       Assessment: Pharmacy consulted to dose heparin in this 55 year old female admitted with PE.  CrCl = 89.3 ml/min No prior anticoag noted.   Goal of Therapy:  Heparin level 0.3-0.7 units/ml Monitor platelets by anticoagulation protocol: Yes   Plan:  Give 5000 units bolus x 1 Start heparin infusion at 1600 units/hr Check anti-Xa level in 6 hours and daily while on heparin Continue to monitor H&H and platelets  Corrissa Martello D 05/14/2021,1:05 AM

## 2021-05-14 NOTE — Progress Notes (Addendum)
ANTICOAGULATION CONSULT NOTE   Pharmacy Consult for Heparin  Indication: pulmonary embolus  Allergies  Allergen Reactions   Metformin And Related Diarrhea   Sunscreens Hives   Victoza [Liraglutide] Other (See Comments)    Yeast infections    Patient Measurements: Height: '5\' 6"'$  (167.6 cm) Weight: 135.2 kg (298 lb) IBW/kg (Calculated) : 59.3 Heparin Dosing Weight: 92.4 kg   Vital Signs: Temp: 97.7 F (36.5 C) (09/03 0844) Temp Source: Oral (09/03 0545) BP: 137/70 (09/03 0844) Pulse Rate: 81 (09/03 0844)  Labs: Recent Labs    05/14/21 0250 05/14/21 0918  HGB 8.8*  --   HCT 27.8*  --   PLT 322  --   APTT 31  --   LABPROT 13.7  --   INR 1.1  --   HEPARINUNFRC  --  0.56  CREATININE 0.84  --   TROPONINIHS 3  --     Estimated Creatinine Clearance: 108.4 mL/min (by C-G formula based on SCr of 0.84 mg/dL).   Medical History: Past Medical History:  Diagnosis Date   Diabetes mellitus without complication ([place])    Hyperlipidemia    Hypertension     Medications:  Medications Prior to Admission  Medication Sig Dispense Refill Last Dose   amLODipine (NORVASC) 5 MG tablet Take 5 mg by mouth daily.      aspirin EC 81 MG EC tablet Take 1 tablet (81 mg total) by mouth daily. 30 tablet 0    cholecalciferol (VITAMIN D3) 25 MCG (1000 UNIT) tablet Take 5,000 Units by mouth daily.      dapagliflozin propanediol (FARXIGA) 10 MG TABS tablet Take 10 mg by mouth daily.      Dulaglutide 1.5 MG/0.5ML SOPN Inject 1.5 mg into the skin once a week.      glimepiride (AMARYL) 4 MG tablet Take 4 mg by mouth daily with breakfast.      Insulin Degludec 200 UNIT/ML SOPN Inject 40 Units into the skin daily.      losartan (COZAAR) 100 MG tablet Take 100 mg by mouth daily.      oxyCODONE (OXY IR/ROXICODONE) 5 MG immediate release tablet Take 1 tablet (5 mg total) by mouth every 4 (four) hours as needed. 6 tablet 0    predniSONE (DELTASONE) 10 MG tablet Take 1 tablet by mouth daily.       rosuvastatin (CRESTOR) 20 MG tablet Take 1 tablet (20 mg total) by mouth daily. 30 tablet 0    tranexamic acid (LYSTEDA) 650 MG TABS tablet Take 650 mg by mouth 3 (three) times daily.       Assessment: Pharmacy consulted to dose heparin in this 55 year old female admitted with PE.  CBC stable.  No prior anticoag noted.   9/3 0918 HL 0.56   Goal of Therapy:  Heparin level 0.3-0.7 units/ml Monitor platelets by anticoagulation protocol: Yes   Plan:  Heparin level is therapeutic. Will continue heparin infusion at 1600 units/hr. Recheck heparin level in 6 hours. CBC daily while on heparin. F/u with transition to PO AC.     Oswald Hillock, PharmD,  05/14/2021,10:36 AM

## 2021-05-15 DIAGNOSIS — D500 Iron deficiency anemia secondary to blood loss (chronic): Secondary | ICD-10-CM

## 2021-05-15 DIAGNOSIS — Z794 Long term (current) use of insulin: Secondary | ICD-10-CM

## 2021-05-15 DIAGNOSIS — I82402 Acute embolism and thrombosis of unspecified deep veins of left lower extremity: Secondary | ICD-10-CM

## 2021-05-15 DIAGNOSIS — E119 Type 2 diabetes mellitus without complications: Secondary | ICD-10-CM

## 2021-05-15 LAB — GLUCOSE, CAPILLARY
Glucose-Capillary: 92 mg/dL (ref 70–99)
Glucose-Capillary: 116 mg/dL — ABNORMAL HIGH (ref 70–99)
Glucose-Capillary: 118 mg/dL — ABNORMAL HIGH (ref 70–99)

## 2021-05-15 LAB — BASIC METABOLIC PANEL
Anion gap: 5 (ref 5–15)
BUN: 8 mg/dL (ref 6–20)
CO2: 25 mmol/L (ref 22–32)
Calcium: 8.5 mg/dL — ABNORMAL LOW (ref 8.9–10.3)
Chloride: 109 mmol/L (ref 98–111)
Creatinine, Ser: 0.82 mg/dL (ref 0.44–1.00)
GFR, Estimated: >60 mL/min (ref >60)
Glucose, Bld: 86 mg/dL (ref 70–99)
Potassium: 3.6 mmol/L (ref 3.5–5.1)
Sodium: 139 mmol/L (ref 135–145)

## 2021-05-15 LAB — CBC
HCT: 26.6 % — ABNORMAL LOW (ref 36.0–46.0)
Hemoglobin: 8.4 g/dL — ABNORMAL LOW (ref 12.0–15.0)
MCH: 26.8 pg (ref 26.0–34.0)
MCHC: 31.6 g/dL (ref 30.0–36.0)
MCV: 85 fL (ref 80.0–100.0)
Platelets: 305 10*3/uL (ref 150–400)
RBC: 3.13 MIL/uL — ABNORMAL LOW (ref 3.87–5.11)
RDW: 17.6 % — ABNORMAL HIGH (ref 11.5–15.5)
WBC: 5.8 10*3/uL (ref 4.0–10.5)
nRBC: 0 % (ref 0.0–0.2)

## 2021-05-15 LAB — HEPARIN LEVEL (UNFRACTIONATED)
Heparin Unfractionated: >1.1 IU/mL — ABNORMAL HIGH (ref 0.30–0.70)
Heparin Unfractionated: 0.85 IU/mL — ABNORMAL HIGH (ref 0.30–0.70)

## 2021-05-15 MED ORDER — APIXABAN 5 MG PO TABS: 5.0000 mg | ORAL_TABLET | Freq: Two times a day (BID) | ORAL | Status: DC

## 2021-05-15 MED ORDER — APIXABAN (ELIQUIS) VTE STARTER PACK (10MG AND 5MG): ORAL_TABLET | ORAL | 0 refills | Status: AC

## 2021-05-15 MED ORDER — APIXABAN 5 MG PO TABS
10.0000 mg | ORAL_TABLET | Freq: Two times a day (BID) | ORAL | Status: DC
  Administered 2021-05-15: 10 mg via ORAL
  Filled 2021-05-15: qty 2

## 2021-05-15 NOTE — Progress Notes (Signed)
ANTICOAGULATION CONSULT NOTE   Pharmacy Consult for Heparin  Indication: pulmonary embolus  Allergies  Allergen Reactions   Metformin And Related Diarrhea   Sunscreens Hives   Victoza [Liraglutide] Other (See Comments)    Yeast infections    Patient Measurements: Height: '5\' 6"'$  (167.6 cm) Weight: (!) 137.6 kg (303 lb 5.7 oz) IBW/kg (Calculated) : 59.3 Heparin Dosing Weight: 92.4 kg   Vital Signs: Temp: 97.8 F (36.6 C) (09/04 0424) Temp Source: Oral (09/04 0424) BP: 136/74 (09/04 0424) Pulse Rate: 80 (09/04 0424)  Labs: Recent Labs    05/14/21 0250 05/14/21 0918 05/14/21 1454 05/15/21 0420 05/15/21 0553  HGB 8.8*  --   --  8.4*  --   HCT 27.8*  --   --  26.6*  --   PLT 322  --   --  305  --   APTT 31  --   --   --   --   LABPROT 13.7  --   --   --   --   INR 1.1  --   --   --   --   HEPARINUNFRC  --    < > 0.31 >1.10* 0.85*  CREATININE 0.84  --   --  0.82  --   TROPONINIHS 3  --   --   --   --    < > = values in this interval not displayed.     Estimated Creatinine Clearance: 112.2 mL/min (by C-G formula based on SCr of 0.82 mg/dL).   Medical History: Past Medical History:  Diagnosis Date   Diabetes mellitus without complication ([place])    Hyperlipidemia    Hypertension     Medications:  Medications Prior to Admission  Medication Sig Dispense Refill Last Dose   amLODipine (NORVASC) 5 MG tablet Take 5 mg by mouth daily.      aspirin EC 81 MG EC tablet Take 1 tablet (81 mg total) by mouth daily. 30 tablet 0    cholecalciferol (VITAMIN D3) 25 MCG (1000 UNIT) tablet Take 5,000 Units by mouth daily.      dapagliflozin propanediol (FARXIGA) 10 MG TABS tablet Take 10 mg by mouth daily.      Dulaglutide 1.5 MG/0.5ML SOPN Inject 1.5 mg into the skin once a week.      glimepiride (AMARYL) 4 MG tablet Take 4 mg by mouth daily with breakfast.      Insulin Degludec 200 UNIT/ML SOPN Inject 40 Units into the skin daily.      losartan (COZAAR) 100 MG tablet Take 100  mg by mouth daily.      oxyCODONE (OXY IR/ROXICODONE) 5 MG immediate release tablet Take 1 tablet (5 mg total) by mouth every 4 (four) hours as needed. 6 tablet 0    predniSONE (DELTASONE) 10 MG tablet Take 1 tablet by mouth daily.      rosuvastatin (CRESTOR) 20 MG tablet Take 1 tablet (20 mg total) by mouth daily. 30 tablet 0    tranexamic acid (LYSTEDA) 650 MG TABS tablet Take 650 mg by mouth 3 (three) times daily.       Assessment: Pharmacy consulted to dose heparin in this 55 year old female admitted with PE.  CBC stable.  No prior anticoag noted. Assessing whether d/c home with eliquis vs needing IR place removable IVC filter pending on vaginal bleeding while on anticoagulation.   9/3 @ 0918 HL 0.56 (therapeutic) 9/3 @ 1532 HL 0.31 (therapeutic) 9/4 @ 0420 HL > 1.10 SUPRAtherapeutic -  drew from line running heparin 9/4 '@0553'$  HL 0.85   Goal of Therapy:  Heparin level 0.3-0.7 units/ml Monitor platelets by anticoagulation protocol: Yes   Plan:  Heparin level is supratherapeutic. Will decrease heparin infusion to 1450 units/hr. Recheck heparin level in 6 hours. CBC daily while on heparin.    Oswald Hillock, PharmD,  05/15/2021 7:43 AM

## 2021-05-15 NOTE — Progress Notes (Signed)
ANTICOAGULATION CONSULT NOTE   Pharmacy Consult for Heparin  Indication: pulmonary embolus  Allergies  Allergen Reactions   Metformin And Related Diarrhea   Sunscreens Hives   Victoza [Liraglutide] Other (See Comments)    Yeast infections    Patient Measurements: Height: '5\' 6"'$  (167.6 cm) Weight: 135.2 kg (298 lb) IBW/kg (Calculated) : 59.3 Heparin Dosing Weight: 92.4 kg   Vital Signs: Temp: 97.8 F (36.6 C) (09/04 0424) Temp Source: Oral (09/04 0424) BP: 136/74 (09/04 0424) Pulse Rate: 80 (09/04 0424)  Labs: Recent Labs    05/14/21 0250 05/14/21 0918 05/14/21 1454 05/15/21 0420  HGB 8.8*  --   --  8.4*  HCT 27.8*  --   --  26.6*  PLT 322  --   --  305  APTT 31  --   --   --   LABPROT 13.7  --   --   --   INR 1.1  --   --   --   HEPARINUNFRC  --  0.56 0.31 >1.10*  CREATININE 0.84  --   --  0.82  TROPONINIHS 3  --   --   --      Estimated Creatinine Clearance: 111.1 mL/min (by C-G formula based on SCr of 0.82 mg/dL).   Medical History: Past Medical History:  Diagnosis Date   Diabetes mellitus without complication ([place])    Hyperlipidemia    Hypertension     Medications:  Medications Prior to Admission  Medication Sig Dispense Refill Last Dose   amLODipine (NORVASC) 5 MG tablet Take 5 mg by mouth daily.      aspirin EC 81 MG EC tablet Take 1 tablet (81 mg total) by mouth daily. 30 tablet 0    cholecalciferol (VITAMIN D3) 25 MCG (1000 UNIT) tablet Take 5,000 Units by mouth daily.      dapagliflozin propanediol (FARXIGA) 10 MG TABS tablet Take 10 mg by mouth daily.      Dulaglutide 1.5 MG/0.5ML SOPN Inject 1.5 mg into the skin once a week.      glimepiride (AMARYL) 4 MG tablet Take 4 mg by mouth daily with breakfast.      Insulin Degludec 200 UNIT/ML SOPN Inject 40 Units into the skin daily.      losartan (COZAAR) 100 MG tablet Take 100 mg by mouth daily.      oxyCODONE (OXY IR/ROXICODONE) 5 MG immediate release tablet Take 1 tablet (5 mg total) by mouth  every 4 (four) hours as needed. 6 tablet 0    predniSONE (DELTASONE) 10 MG tablet Take 1 tablet by mouth daily.      rosuvastatin (CRESTOR) 20 MG tablet Take 1 tablet (20 mg total) by mouth daily. 30 tablet 0    tranexamic acid (LYSTEDA) 650 MG TABS tablet Take 650 mg by mouth 3 (three) times daily.       Assessment: Pharmacy consulted to dose heparin in this 55 year old female admitted with PE.  CBC stable.  No prior anticoag noted.   9/3 @ 0918 HL 0.56 (therapeutic) 9/3 @ 1532 HL 0.31 (therapeutic) 9/4 @ 0420 HL > 1.10 SUPRAtherapeutic  Goal of Therapy:  Heparin level 0.3-0.7 units/ml Monitor platelets by anticoagulation protocol: Yes   Plan:  9/4:  HL @ 0420 = > 1.10.   This is very large jump from previous result of 0.31.   Spoke with lab tech who said RN drew sample from central line so is probably invalid.  Will repeat HL @ 0600.  Kenika Sahm D 05/15/2021 5:57 AM

## 2021-05-15 NOTE — Progress Notes (Signed)
Eliquis coupon printed to floor, RN notified.  Asked MD and Pharmacy if any other new meds. Can check for coupons if needed.  Patient does have insurance.   Oleh Genin, [place]

## 2021-05-15 NOTE — Plan of Care (Signed)
VSS, midline access removed, starting dose of eloquis given

## 2021-05-15 NOTE — Discharge Summary (Signed)
Discharge Summary  Rhonda Mccall [id number] DOB: 1966-08-07  PCP: Patient, No Pcp Per (Inactive)  Admit date: 05/14/2021 Discharge date: 05/15/2021  Time spent: 16mns, more than 50% time spent on coordination of care.  Recommendations for Outpatient Follow-up:  F/u with gyn within a week  for hospital discharge follow up    Discharge Diagnoses:  Active Hospital Problems   Diagnosis Date Noted   Pulmonary emboli ([facility] 05/14/2021   PE (pulmonary [place]embolism) ([facility] 05/13/2021    Resolved Hospital Problems  No resolved problems to display.    Discharge Condition: stable  Diet recommendation: heart healthy/carb modified  Filed Weights   05/14/21 0049 05/15/21 0424  Weight: 135.2 kg (!) 137.6 kg    History of present illness: ( per admitting MD Dr TMarcello Moores Chief Complaint:  dvt/pe   HPI: Rhonda Mccall a 55y.o. female with medical history significant of  Patient is a 55y/o with   diabetes mellitus, hypertension and dyslipidemia interim hx of  recent menorrhagia for which she had D&C on Monday 05/02/2021, prbc found to have large fibroid with plans of lap TAH, patient of note during that time has complication of throat inflammation requiring use of steroids with was complicated by return to ed with hyperglycemia. Patient stabilized and at f/u with OB/GYN re up coming surgery , she was noted to have increase doe and calf pain on evaluation found to have dvt and subsegmental DVT , patient is admitted for iv heparin and monitoring for recurrent bleeding due to her noted history.  Patient is being directly admitted from gyn clinic. Of note if patient has severe bleeding on heparin will need IV for IVC placement. Patient currently notes that she has no sob at rest, no chest pain no n/v/d/f/c/dysuria. She does mention that she continues to have her menstrual cycle but notes it is not as heavy as it was before.  Hospital Course:  Active Problems:   PE (pulmonary thromboembolism)  (HCC)   Pulmonary emboli (HCC)  Acute left lower extremity DVT/PE -She is hemodynamically stable, no hypoxia, no chest pain -She was treated with heparin drip, reports menses is decreasing, hemoglobin stable -Case discussed with GYN on-call who agreed to switch to Eliquis and discharged patient home with close follow-up with Dr. BLeafy Ro Hypokalemia replaced K  Insulin-dependent type 2 diabetes Blood glucose stable on current regimen continue She is followed by endocrinology  Hypertension/hyperlipidemia Continue home medication  Report history of CVA Restarted on Eliquis, discontinue aspirin due to her risk of bleeding Continue statin She is followed by her neurology  Class III obesity Body mass index is 48.96 kg/m. recommend weight loss  For IV access, recorded midline placement in the hospital, removed prior to discharge  Procedures: Midline placement and removal  Consultations: Phone conversation with GYN on-call 9/4  Discharge Exam: BP 123/74 (BP Location: Left Arm)   Pulse 75   Temp 97.7 F (36.5 C) (Oral)   Resp 18   Ht '5\' 6"'$  (1.676 m)   Wt (!) 137.6 kg   SpO2 100%   BMI 48.96 kg/m   General: NAD Cardiovascular: RRR Respiratory: Normal respiratory effort  Discharge Instructions You were cared for by a hospitalist during your hospital stay. If you have any questions about your discharge medications or the care you received while you were in the hospital after you are discharged, you can call the unit and asked to speak with the hospitalist on call if the hospitalist that took care of you is not  available. Once you are discharged, your primary care physician will handle any further medical issues. Please note that NO REFILLS for any discharge medications will be authorized once you are discharged, as it is imperative that you return to your primary care physician (or establish a relationship with a primary care physician if you do not have one) for your  aftercare needs so that they can reassess your need for medications and monitor your lab values.  Discharge Instructions     Diet - low sodium heart healthy   Complete by: As directed    Increase activity slowly   Complete by: As directed       Allergies as of 05/15/2021       Reactions   Metformin And Related Diarrhea   Sunscreens Hives   Victoza [liraglutide] Other (See Comments)   Yeast infections        Medication List     STOP taking these medications    aspirin 81 MG EC tablet   oxyCODONE 5 MG immediate release tablet Commonly known as: Oxy IR/ROXICODONE   predniSONE 10 MG tablet Commonly known as: DELTASONE   tranexamic acid 650 MG Tabs tablet Commonly known as: LYSTEDA       TAKE these medications    amLODipine 5 MG tablet Commonly known as: NORVASC Take 5 mg by mouth daily.   Apixaban Starter Pack ('10mg'$  and '5mg'$ ) Commonly known as: ELIQUIS STARTER PACK Take as directed on package: start with two-'5mg'$  tablets twice daily for 7 days. On day 8, switch to one-'5mg'$  tablet twice daily.   cholecalciferol 25 MCG (1000 UNIT) tablet Commonly known as: VITAMIN D3 Take 5,000 Units by mouth daily.   dapagliflozin propanediol 10 MG Tabs tablet Commonly known as: FARXIGA Take 10 mg by mouth daily.   Dulaglutide 1.5 MG/0.5ML Sopn Inject 1.5 mg into the skin once a week.   glimepiride 4 MG tablet Commonly known as: AMARYL Take 4 mg by mouth daily with breakfast.   insulin degludec 200 UNIT/ML FlexTouch Pen Commonly known as: TRESIBA Inject 40 Units into the skin daily.   losartan 100 MG tablet Commonly known as: COZAAR Take 100 mg by mouth daily.   rosuvastatin 20 MG tablet Commonly known as: CRESTOR Take 1 tablet (20 mg total) by mouth daily.       Allergies  Allergen Reactions   Metformin And Related Diarrhea   Sunscreens Hives   Victoza [Liraglutide] Other (See Comments)    Yeast infections    Follow-up Information     Benjaman Kindler,  MD Follow up in 1 week(s).   Specialty: Obstetrics and Gynecology Contact information: [place]  [address] [phone]                  The results of significant diagnostics from this hospitalization (including imaging, microbiology, ancillary and laboratory) are listed below for reference.    Significant Diagnostic Studies: CT Angio Chest Pulmonary Embolism (PE) W or WO Contrast  Result Date: 05/13/2021 CLINICAL DATA:  PE suspected, high prob Dyspnea with exertion, tachycardia, known DVT left calf, on thrombogenic EXAM: CT ANGIOGRAPHY CHEST WITH CONTRAST TECHNIQUE: Multidetector CT imaging of the chest was performed using the standard protocol during bolus administration of intravenous contrast. Multiplanar CT image reconstructions and MIPs were obtained to evaluate the vascular anatomy. CONTRAST:  28m OMNIPAQUE IOHEXOL 350 MG/ML SOLN COMPARISON:  None. FINDINGS: Cardiovascular: Satisfactory opacification of pulmonary arteries. There is a right lower lobe subsegmental pulmonary embolism (series 5, image  191). No evidence of right heart strain. The heart is normal in size. No pericardial disease. Normal size main and branch pulmonary arteries. Thoracic aorta is unremarkable. Mediastinum/Nodes: There is no mediastinal, hilar, or axillary lymphadenopathy. The thyroid is unremarkable. The trachea is unremarkable. There is a small hiatal hernia. Lungs/Pleura: The airways are patent. There is no focal airspace consolidation. There is no pleural effusion or pneumothorax. Upper Abdomen: No acute abnormality. Musculoskeletal: There is a 2.9 x 2.0 cm right breast mass (series 4, image 42) in the central subareolar breast. There are bilateral breast calcifications. No acute osseous abnormality. No suspicious lytic or blastic lesions. Multilevel degenerative changes of the spine. Review of the MIP images confirms the above findings. IMPRESSION: Acute right lower lobe subsegmental  pulmonary embolism. No evidence of right heart strain. No focal airspace disease. 2.9 x 2.0 cm right breast mass in the central subareolar breast. Recommend correlation with mammography. Critical Value/emergent results were called by telephone at the time of interpretation on 05/13/2021 at 7:15 pm to provider [facility] , who verbally acknowledged these results. Electronically Signed   By: Maurine Simmering M.D.   On: 05/13/2021 19:17   US Venous Img Lower Unilateral Left (DVT)  Result Date: 05/13/2021 CLINICAL DATA:  eval: DVT EXAM: LEFT LOWER EXTREMITY VENOUS DOPPLER ULTRASOUND TECHNIQUE: Gray-scale sonography with graded compression, as well as color Doppler and duplex ultrasound were performed to evaluate the lower extremity deep venous systems from the level of the common femoral vein and including the common femoral, femoral, profunda femoral, popliteal and calf veins including the posterior tibial, peroneal and gastrocnemius veins when visible. The superficial great saphenous vein was also interrogated. Spectral Doppler was utilized to evaluate flow at rest and with distal augmentation maneuvers in the common femoral, femoral and popliteal veins. COMPARISON:  None. FINDINGS: Contralateral Common Femoral Vein: Respiratory phasicity is normal and symmetric with the symptomatic side. No evidence of thrombus. Normal compressibility. Common Femoral Vein: No evidence of thrombus. Normal compressibility, respiratory phasicity and response to augmentation. Saphenofemoral Junction: No evidence of thrombus. Normal compressibility and flow on color Doppler imaging. Profunda Femoral Vein: No evidence of thrombus. Normal compressibility and flow on color Doppler imaging. Femoral Vein: No evidence of thrombus. Normal compressibility, respiratory phasicity and response to augmentation. Popliteal Vein: No evidence of thrombus. Normal compressibility, respiratory phasicity and response to augmentation. Calf Veins: There is  occlusive thrombus in 1 of the peroneal veins. The remainder of the deep veins of the calf are patent. Other Findings:  None. IMPRESSION: 1. There appears to be occlusive thrombus limited to 1 of the peroneal veins (deep vein) in the calf. The remainder of the deep veins of the calf and more central left lower extremity deep veins are widely patent. These results will be called to the ordering clinician or representative by the Radiologist Assistant, and communication documented in the PACS or [employer]. Electronically Signed   By: Albin Felling M.D.   On: 05/13/2021 16:28    Microbiology: Recent Results (from the past 240 hour(s))  Resp Panel by RT-PCR (Flu A&B, Covid) Nasopharyngeal Swab     Status: None   Collection Time: 05/05/21 11:46 PM   Specimen: Nasopharyngeal Swab; Nasopharyngeal(NP) swabs in vial transport medium  Result Value Ref Range Status   SARS Coronavirus 2 by RT PCR NEGATIVE NEGATIVE Final    Comment: (NOTE) SARS-CoV-2 target nucleic acids are NOT DETECTED.  The SARS-CoV-2 RNA is generally detectable in upper respiratory specimens during the acute phase of  infection. The lowest concentration of SARS-CoV-2 viral copies this assay can detect is 138 copies/mL. A negative result does not preclude SARS-Cov-2 infection and should not be used as the sole basis for treatment or other patient management decisions. A negative result may occur with  improper specimen collection/handling, submission of specimen other than nasopharyngeal swab, presence of viral mutation(s) within the areas targeted by this assay, and inadequate number of viral copies(<138 copies/mL). A negative result must be combined with clinical observations, patient history, and epidemiological information. The expected result is Negative.  Fact Sheet for Patients:  EntrepreneurPulse.com.au  Fact Sheet for Healthcare Providers:  IncredibleEmployment.be  This test is  no t yet approved or cleared by the [place] FDA and  has been authorized for detection and/or diagnosis of SARS-CoV-2 by FDA under an Emergency Use Authorization (EUA). This EUA will remain  in effect (meaning this test can be used) for the duration of the COVID-19 declaration under Section 564(b)(1) of the Act, 21 U.S.C.section 360bbb-3(b)(1), unless the authorization is terminated  or revoked sooner.       Influenza A by PCR NEGATIVE NEGATIVE Final   Influenza B by PCR NEGATIVE NEGATIVE Final    Comment: (NOTE) The Xpert Xpress SARS-CoV-2/FLU/RSV plus assay is intended as an aid in the diagnosis of influenza from Nasopharyngeal swab specimens and should not be used as a sole basis for treatment. Nasal washings and aspirates are unacceptable for Xpert Xpress SARS-CoV-2/FLU/RSV testing.  Fact Sheet for Patients: EntrepreneurPulse.com.au  Fact Sheet for Healthcare Providers: IncredibleEmployment.be  This test is not yet approved or cleared by the [place] FDA and has been authorized for detection and/or diagnosis of SARS-CoV-2 by FDA under an Emergency Use Authorization (EUA). This EUA will remain in effect (meaning this test can be used) for the duration of the COVID-19 declaration under Section 564(b)(1) of the Act, 21 U.S.C. section 360bbb-3(b)(1), unless the authorization is terminated or revoked.  Performed at [facility], [place], [place] [address]   SARS CORONAVIRUS 2 (TAT 6-24 HRS) Nasopharyngeal Nasopharyngeal Swab     Status: None   Collection Time: 05/14/21  3:30 AM   Specimen: Nasopharyngeal Swab  Result Value Ref Range Status   SARS Coronavirus 2 NEGATIVE NEGATIVE Final    Comment: (NOTE) SARS-CoV-2 target nucleic acids are NOT DETECTED.  The SARS-CoV-2 RNA is generally detectable in upper and lower respiratory specimens during the acute phase of infection. Negative results do not  preclude SARS-CoV-2 infection, do not rule out co-infections with other pathogens, and should not be used as the sole basis for treatment or other patient management decisions. Negative results must be combined with clinical observations, patient history, and epidemiological information. The expected result is Negative.  Fact Sheet for Patients: SugarRoll.be  Fact Sheet for Healthcare Providers: https://www.woods-mathews.com/  This test is not yet approved or cleared by the [place] FDA and  has been authorized for detection and/or diagnosis of SARS-CoV-2 by FDA under an Emergency Use Authorization (EUA). This EUA will remain  in effect (meaning this test can be used) for the duration of the COVID-19 declaration under Se ction 564(b)(1) of the Act, 21 U.S.C. section 360bbb-3(b)(1), unless the authorization is terminated or revoked sooner.  Performed at [facility], [place] [address], [place], [place] [address]      Labs: Basic Metabolic Panel: Recent Labs  Lab 05/14/21 0250 05/15/21 0420  NA 138 139  K 3.3* 3.6  CL 103 109  CO2 28  25  GLUCOSE 172* 86  BUN 9 8  CREATININE 0.84 0.82  CALCIUM 8.7* 8.5*  MG 2.4  --    Liver Function Tests: Recent Labs  Lab 05/14/21 0250  AST 17  ALT 15  ALKPHOS 43  BILITOT 0.7  PROT 6.4*  ALBUMIN 3.3*   No results for input(s): LIPASE, AMYLASE in the last 168 hours. No results for input(s): AMMONIA in the last 168 hours. CBC: Recent Labs  Lab 05/14/21 0250 05/15/21 0420  WBC 6.7 5.8  NEUTROABS 4.4  --   HGB 8.8* 8.4*  HCT 27.8* 26.6*  MCV 82.7 85.0  PLT 322 305   Cardiac Enzymes: No results for input(s): CKTOTAL, CKMB, CKMBINDEX, TROPONINI in the last 168 hours. BNP: BNP (last 3 results) Recent Labs    05/14/21 0250  BNP 3.6    ProBNP (last 3 results) No results for input(s): PROBNP in the last 8760 hours.  CBG: Recent Labs  Lab 05/14/21 1633 05/14/21 2316  05/15/21 0725 05/15/21 1133 05/15/21 1459  GLUCAP 99 77 92 116* 118*       Signed:  Florencia Reasons MD, PhD, FACP  [facility] 05/15/2021, 6:09 PM

## 2021-05-17 ENCOUNTER — Other Ambulatory Visit: Payer: Self-pay | Admitting: Obstetrics and Gynecology

## 2021-05-17 DIAGNOSIS — D62 Acute posthemorrhagic anemia: Secondary | ICD-10-CM

## 2021-05-17 NOTE — Progress Notes (Signed)
IV iron for acute on chronic blood loss anemia

## 2021-05-20 ENCOUNTER — Ambulatory Visit
Admission: RE | Admit: 2021-05-20 | Discharge: 2021-05-20 | Disposition: A | Discharge status: Home / Self Care | Payer: Managed Care, Other (non HMO) | Source: Ambulatory Visit | Attending: Obstetrics and Gynecology | Admitting: Obstetrics and Gynecology

## 2021-05-20 DIAGNOSIS — D62 Acute posthemorrhagic anemia: Secondary | ICD-10-CM | POA: Insufficient documentation

## 2021-05-20 MED ORDER — SODIUM CHLORIDE 0.9 % IV SOLN
300.0000 mg | INTRAVENOUS | Status: DC
  Administered 2021-05-20: 300 mg via INTRAVENOUS
  Filled 2021-05-20: qty 15

## 2021-05-20 MED ORDER — SODIUM CHLORIDE 0.9 % IV SOLN: INTRAVENOUS | Status: DC | PRN

## 2021-05-27 ENCOUNTER — Ambulatory Visit
Admission: RE | Admit: 2021-05-27 | Discharge: 2021-05-27 | Disposition: A | Discharge status: Home / Self Care | Payer: Managed Care, Other (non HMO) | Source: Ambulatory Visit | Attending: Obstetrics and Gynecology | Admitting: Obstetrics and Gynecology

## 2021-05-27 ENCOUNTER — Other Ambulatory Visit: Payer: Self-pay

## 2021-05-27 DIAGNOSIS — D62 Acute posthemorrhagic anemia: Secondary | ICD-10-CM | POA: Insufficient documentation

## 2021-05-27 MED ORDER — SODIUM CHLORIDE 0.9 % IV SOLN
300.0000 mg | Freq: Once | INTRAVENOUS | Status: DC
  Filled 2021-05-27: qty 15

## 2021-06-03 ENCOUNTER — Other Ambulatory Visit: Payer: Self-pay

## 2021-06-03 ENCOUNTER — Ambulatory Visit
Admission: RE | Admit: 2021-06-03 | Discharge: 2021-06-03 | Disposition: A | Discharge status: Home / Self Care | Payer: Managed Care, Other (non HMO) | Source: Ambulatory Visit

## 2021-06-03 DIAGNOSIS — D62 Acute posthemorrhagic anemia: Secondary | ICD-10-CM | POA: Diagnosis present

## 2021-06-03 MED ORDER — SODIUM CHLORIDE 0.9 % IV SOLN
300.0000 mg | Freq: Once | INTRAVENOUS | Status: AC
  Administered 2021-06-03: 300 mg via INTRAVENOUS
  Filled 2021-06-03: qty 15

## 2021-06-06 ENCOUNTER — Other Ambulatory Visit: Payer: Managed Care, Other (non HMO)

## 2021-06-08 ENCOUNTER — Ambulatory Visit: Payer: Managed Care, Other (non HMO)

## 2021-06-09 ENCOUNTER — Telehealth: Payer: Self-pay

## 2021-06-09 ENCOUNTER — Other Ambulatory Visit: Payer: Self-pay

## 2021-06-09 DIAGNOSIS — D250 Submucous leiomyoma of uterus: Secondary | ICD-10-CM

## 2021-06-09 DIAGNOSIS — D251 Intramural leiomyoma of uterus: Secondary | ICD-10-CM

## 2021-06-09 NOTE — Telephone Encounter (Signed)
Received message from Dr. Theora Gianotti requesting MRI pelvis to evaluate characteristics of leiomyoma and Depo-Lupron. Order placed for MRI. Depo-Lupron request sent to [facility]. Noted referral to Dr. Tasia Catchings. Will assist in getting this coordinated.   [facility] indicates her ovaries are still working. Can you please help set up the Depo-Lupron shots for her at [place]. Plan the one month shot first 3.75 mg to make sure she can tolerate the medication. I

## 2021-06-10 ENCOUNTER — Ambulatory Visit: Payer: Managed Care, Other (non HMO)

## 2021-06-14 ENCOUNTER — Telehealth: Payer: Self-pay

## 2021-06-14 NOTE — Telephone Encounter (Signed)
MRI has been authorized with insurance. They are requiring to speak with Rhonda Mccall before they will provide authorization number. Messages have been left on Rhonda Mccall's voicemail and a MyChart message has been sent. At this time we have not heard back from Rhonda Mccall.

## 2021-06-14 NOTE — Telephone Encounter (Signed)
Attempted to call Ms. Shrewsberry as our new patient coordinator was able to contact her. No answer. Left voicemail that she needs to contact her insurance company regarding MRI. Once she has done this we can schedule MRI. Left number for her to call and her case number for reference. Asked her to call back once this done.

## 2021-06-15 ENCOUNTER — Other Ambulatory Visit: Payer: Self-pay | Admitting: Nurse Practitioner

## 2021-06-15 ENCOUNTER — Telehealth: Payer: Self-pay

## 2021-06-15 DIAGNOSIS — D250 Submucous leiomyoma of uterus: Secondary | ICD-10-CM

## 2021-06-15 DIAGNOSIS — D251 Intramural leiomyoma of uterus: Secondary | ICD-10-CM

## 2021-06-15 NOTE — Progress Notes (Signed)
Order placed for lupron 3.75 once. Order signed and held. Have been unable to reach patient to schedule. Will send schedule message. She will need an injection appointment for lupron only.

## 2021-06-15 NOTE — Telephone Encounter (Signed)
MRI has been scheduled. Left a detailed voicemail with the appointment details. Provided the scheduling phone number to call if the date/time does not work for her.

## 2021-06-20 ENCOUNTER — Other Ambulatory Visit: Payer: Self-pay | Admitting: Nurse Practitioner

## 2021-06-20 DIAGNOSIS — D251 Intramural leiomyoma of uterus: Secondary | ICD-10-CM

## 2021-06-20 DIAGNOSIS — D250 Submucous leiomyoma of uterus: Secondary | ICD-10-CM

## 2021-06-23 ENCOUNTER — Ambulatory Visit
Admission: RE | Admit: 2021-06-23 | Discharge: 2021-06-23 | Disposition: A | Discharge status: Home / Self Care | Payer: Managed Care, Other (non HMO) | Source: Ambulatory Visit | Attending: Obstetrics and Gynecology | Admitting: Obstetrics and Gynecology

## 2021-06-23 ENCOUNTER — Other Ambulatory Visit: Payer: Self-pay

## 2021-06-23 DIAGNOSIS — D251 Intramural leiomyoma of uterus: Secondary | ICD-10-CM | POA: Insufficient documentation

## 2021-06-23 DIAGNOSIS — D250 Submucous leiomyoma of uterus: Secondary | ICD-10-CM | POA: Diagnosis present

## 2021-06-23 MED ORDER — GADOBUTROL 1 MMOL/ML IV SOLN
10.0000 mL | Freq: Once | INTRAVENOUS | Status: AC | PRN
  Administered 2021-06-23: 10 mL via INTRAVENOUS

## 2021-06-24 ENCOUNTER — Ambulatory Visit: Payer: Managed Care, Other (non HMO)

## 2021-06-24 ENCOUNTER — Other Ambulatory Visit: Payer: Managed Care, Other (non HMO)

## 2021-06-24 ENCOUNTER — Encounter: Payer: Managed Care, Other (non HMO) | Admitting: Oncology

## 2021-06-27 ENCOUNTER — Inpatient Hospital Stay: Payer: Managed Care, Other (non HMO)

## 2021-06-28 ENCOUNTER — Inpatient Hospital Stay: Payer: Managed Care, Other (non HMO)

## 2021-06-28 ENCOUNTER — Other Ambulatory Visit: Payer: Self-pay

## 2021-06-28 ENCOUNTER — Inpatient Hospital Stay: Payer: Managed Care, Other (non HMO) | Attending: Oncology | Admitting: Oncology

## 2021-06-28 ENCOUNTER — Encounter: Payer: Self-pay | Admitting: Oncology

## 2021-06-28 VITALS — BP 149/81 | HR 88 | Temp 96.6°F | Resp 17 | Wt 305.0 lb

## 2021-06-28 DIAGNOSIS — D500 Iron deficiency anemia secondary to blood loss (chronic): Secondary | ICD-10-CM | POA: Diagnosis not present

## 2021-06-28 DIAGNOSIS — D509 Iron deficiency anemia, unspecified: Secondary | ICD-10-CM

## 2021-06-28 DIAGNOSIS — D259 Leiomyoma of uterus, unspecified: Secondary | ICD-10-CM

## 2021-06-28 DIAGNOSIS — N6310 Unspecified lump in the right breast, unspecified quadrant: Secondary | ICD-10-CM | POA: Insufficient documentation

## 2021-06-28 DIAGNOSIS — I2699 Other pulmonary embolism without acute cor pulmonale: Secondary | ICD-10-CM | POA: Diagnosis not present

## 2021-06-28 MED ORDER — LEUPROLIDE ACETATE 3.75 MG IM KIT
3.7500 mg | PACK | Freq: Once | INTRAMUSCULAR | Status: DC
  Filled 2021-06-28: qty 3.75

## 2021-06-28 NOTE — Progress Notes (Signed)
Showed  Hematology/Oncology Consult note [facility] Telephone:([phone] [phone]  Patient Care Team: Patient, No Pcp Per (Inactive) as PCP - General (General Practice)   Name of the patient: Rhonda Mccall  [id number]  04/10/1966    Reason for referral-acute DVT   Referring physician-Dr. Benjaman Kindler  Date of visit: 06/28/21   History of presenting illness-patient is a 54 year old African-American female with a past medical history significant for hypertension hyperlipidemia type 2 diabetes among other medical problems.  She recently underwent left lower extremity ultrasound on 05/13/2021 which showed occlusive thrombus limited to the peroneal vein of the calf.  Remainder of the deep veins were patent.  CT angio chest showed acute right lower lobe subsegmental pulmonary embolus with no evidence of right heart strain.  This was done after patient was started on Lysteda for her heavy menstrual cycles and patient complained of left leg pain following the start of the drug.  Incidentally a 2.9 x 2 cm right breast mass was also reported and a mammogram was recommended which the patient has not had yet.  MR pelvis with and without contrast on 06/23/2021 showed multiple uterine fibroids.  Patient has seen GYN oncology for this and hopes to go for surgery soon which can be potentially delayed due to new diagnosis of pulmonary embolism.  In the meanwhile to control her menstrual cycles she was recommended Lupron injections.  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  Similarly she had biopsies of the right breast as well which showed fibroadenoma with microcalcifications.  States that this is being followed at [facility] PS- 0  Pain scale- 0   Review of systems- Review of Systems  Constitutional:  Positive for malaise/fatigue. Negative for chills, fever and weight loss.  HENT:   Negative for congestion, ear discharge and nosebleeds.   Eyes:  Negative for blurred vision.  Respiratory:  Negative for cough, hemoptysis, sputum production, shortness of breath and wheezing.   Cardiovascular:  Negative for chest pain, palpitations, orthopnea and claudication.  Gastrointestinal:  Negative for abdominal pain, blood in stool, constipation, diarrhea, heartburn, melena, nausea and vomiting.  Genitourinary:  Negative for dysuria, flank pain, frequency, hematuria and urgency.  Musculoskeletal:  Negative for back pain, joint pain and myalgias.  Skin:  Negative for rash.  Neurological:  Negative for dizziness, tingling, focal weakness, seizures, weakness and headaches.  Endo/Heme/Allergies:  Does not bruise/bleed easily.  Psychiatric/Behavioral:  Negative for depression and suicidal ideas. The patient does not have insomnia.    Allergies  Allergen Reactions   Canagliflozin Other (See Comments)    Other reaction(s): Other (See Comments) Yeast infection Yeast infection    Metformin And Related Diarrhea   Sunscreens Hives   Tranexamic Acid Other (See Comments)   Victoza [Liraglutide] Other (See Comments)    Yeast infections    Patient Active Problem List   Diagnosis Date Noted   Pulmonary emboli ([place]) 05/14/2021   PE (pulmonary thromboembolism) ([place]) 05/13/2021   Dysmenorrhea 05/06/2021   Hyperlipidemia due to type 2 diabetes mellitus ([place]) 05/06/2021   Hypertension associated with diabetes ([place]) 05/06/2021   Neck pain 05/06/2021   Obesity 05/06/2021   Perimenopausal menorrhagia 05/06/2021   Uterine fibroid 05/06/2021   Menorrhagia 05/06/2021   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   Type 2 diabetes mellitus with hyperglycemia, with long-term current use of insulin ([place]) 06/25/2014   Vitamin D deficiency 06/25/2014     Past  Medical History:  Diagnosis Date   Diabetes mellitus without complication ([place])    Embolism and thrombosis ([place]) 05/2021   GERD  (gastroesophageal reflux disease)    Hyperlipidemia    Hypertension    IDA (iron deficiency anemia)      Past Surgical History:  Procedure Laterality Date   COLONOSCOPY WITH ESOPHAGOGASTRODUODENOSCOPY (EGD)     HYSTEROSCOPY WITH D & C N/A 05/02/2021   Procedure: DILATATION AND CURETTAGE /HYSTEROSCOPY;  Surgeon: Benjaman Kindler, MD;  Location: [facility] ORS;  Service: Gynecology;  Laterality: N/A;    Social History   Socioeconomic History   Marital status: Single    Spouse name: Not on file   Number of children: Not on file   Years of education: Not on file   Highest education level: Not on file  Occupational History   Not on file  Tobacco Use   Smoking status: Never   Smokeless tobacco: Never  Substance and Sexual Activity   Alcohol use: No   Drug use: Never   Sexual activity: Not on file  Other Topics Concern   Not on file  Social History Narrative   Not on file   Social Determinants of Health   Financial Resource Strain: Not on file  Food Insecurity: Not on file  Transportation Needs: Not on file  Physical Activity: Not on file  Stress: Not on file  Social Connections: Not on file  Intimate Partner Violence: Not on file     Family History  Problem Relation Age of Onset   Diabetes Mother    Hypertension Mother    Stroke Mother    Pancreatic cancer Father      Current Outpatient Medications:    amLODipine (NORVASC) 10 MG tablet, Take by mouth., Disp: , Rfl:    amLODipine (NORVASC) 5 MG tablet, Take 5 mg by mouth daily., Disp: , Rfl:    APIXABAN (ELIQUIS) VTE STARTER PACK (10MG  AND 5MG ), Take as directed on package: start with two-5mg  tablets twice daily for 7 days. On day 8, switch to one-5mg  tablet twice daily., Disp: 1 each, Rfl: 0   aspirin 81 MG chewable tablet, , Disp: , Rfl:    Blood Glucose Monitoring Suppl (GLUCOCOM BLOOD GLUCOSE MONITOR) DEVI, 1 each by XX route as directed, Disp: , Rfl:    cholecalciferol (VITAMIN D3) 25 MCG (1000 UNIT) tablet, Take  5,000 Units by mouth daily., Disp: , Rfl:    Cholecalciferol 125 MCG/ML LIQD, , Disp: , Rfl:    dapagliflozin propanediol (FARXIGA) 10 MG TABS tablet, Take 10 mg by mouth daily., Disp: , Rfl:    Dulaglutide 1.5 MG/0.5ML SOPN, Inject 1.5 mg into the skin once a week., Disp: , Rfl:    ergocalciferol (VITAMIN D2) 1.25 MG (50000 UT) capsule, Take by mouth., Disp: , Rfl:    ezetimibe (ZETIA) 10 MG tablet, Take 10 mg by mouth daily., Disp: , Rfl:    gentamicin (GARAMYCIN) 0.3 % ophthalmic solution, Place into the right eye., Disp: , Rfl:    glimepiride (AMARYL) 4 MG tablet, Take 4 mg by mouth daily with breakfast., Disp: , Rfl:    glucose blood ([employer] BLOOD GLUCOSE TEST) test strip, Use 1 each (1 strip total) 4 (four) times daily Use as instructed., Disp: , Rfl:    Insulin Degludec 200 UNIT/ML SOPN, Inject 40 Units into the skin daily., Disp: , Rfl:    liraglutide (VICTOZA) 18 MG/3ML SOPN, Inject into the skin., Disp: , Rfl:    losartan (COZAAR) 100 MG  tablet, Take 100 mg by mouth daily., Disp: , Rfl:    losartan (COZAAR) 100 MG tablet, Take 1 tablet by mouth daily., Disp: , Rfl:    metFORMIN (GLUCOPHAGE) 1000 MG tablet, Take by mouth., Disp: , Rfl:    rosuvastatin (CRESTOR) 20 MG tablet, Take 1 tablet (20 mg total) by mouth daily., Disp: 30 tablet, Rfl: 0   canagliflozin (INVOKANA) 100 MG TABS tablet, Take by mouth., Disp: , Rfl:    Fluticasone-Umeclidin-Vilant 100-62.5-25 MCG/ACT AEPB, Inhale into the lungs. (Patient not taking: Reported on 06/28/2021), Disp: , Rfl:    hydrOXYzine (ATARAX/VISTARIL) 25 MG tablet, Take by mouth. (Patient not taking: Reported on 06/28/2021), Disp: , Rfl:    montelukast (SINGULAIR) 10 MG tablet, Take by mouth. (Patient not taking: Reported on 06/28/2021), Disp: , Rfl:    predniSONE (DELTASONE) 5 MG tablet, Take 2 tablets daily on days 7 through 9, then 1 tablet daily on days 10 through 14 (Patient not taking: Reported on 06/28/2021), Disp: , Rfl:    Physical exam:   Vitals:   06/28/21 1507  BP: (!) 149/81  Pulse: 88  Resp: 17  Temp: (!) 96.6 F (35.9 C)  TempSrc: Tympanic  SpO2: 100%  Weight: (!) 305 lb (138.3 kg)   Physical Exam Constitutional:      General: She is not in acute distress. Cardiovascular:     Rate and Rhythm: Normal rate and regular rhythm.     Heart sounds: Normal heart sounds.  Pulmonary:     Effort: Pulmonary effort is normal.     Breath sounds: Normal breath sounds.  Abdominal:     General: Bowel sounds are normal.     Palpations: Abdomen is soft.  Skin:    General: Skin is warm and dry.  Neurological:     Mental Status: She is alert and oriented to person, place, and time.       CMP Latest Ref Rng & Units 05/15/2021  Glucose 70 - 99 mg/dL 86  BUN 6 - 20 mg/dL 8  Creatinine 0.44 - 1.00 mg/dL 0.82  Sodium 135 - 145 mmol/L 139  Potassium 3.5 - 5.1 mmol/L 3.6  Chloride 98 - 111 mmol/L 109  CO2 22 - 32 mmol/L 25  Calcium 8.9 - 10.3 mg/dL 8.5(L)  Total Protein 6.5 - 8.1 g/dL -  Total Bilirubin 0.3 - 1.2 mg/dL -  Alkaline Phos 38 - 126 U/L -  AST 15 - 41 U/L -  ALT 0 - 44 U/L -   CBC Latest Ref Rng & Units 05/15/2021  WBC 4.0 - 10.5 K/uL 5.8  Hemoglobin 12.0 - 15.0 g/dL 8.4(L)  Hematocrit 36.0 - 46.0 % 26.6(L)  Platelets 150 - 400 K/uL 305      MR PELVIS W WO CONTRAST  Result Date: 06/24/2021 CLINICAL DATA:  Symptomatic uterine fibroids. Menometrorrhagia. Surgical planning. EXAM: MRI PELVIS WITHOUT AND WITH CONTRAST TECHNIQUE: Multiplanar multisequence MR imaging of the pelvis was performed both before and after administration of intravenous contrast. CONTRAST:  67mL GADAVIST GADOBUTROL 1 MMOL/ML IV SOLN COMPARISON:  None. FINDINGS: Lower Urinary Tract: No bladder or urethral abnormality identified. Bowel:  Unremarkable visualized pelvic bowel loops. Vascular/Lymphatic: [place] bilateral inguinal and external iliac lymph nodes are noted, but no pathologically enlarged lymph nodes are identified. Reproductive:  -- Uterus: Measures 14.5 x 9.1 by 12.6 cm (volume = 870 cm^3). Diffuse uterine involvement by numerous fibroids is seen. These fibroids are intramural, submucosal, and subserosal in location. The largest fibroid is located in the  left lateral corpus measuring 5.6 cm in maximum diameter. -- Intracavitary fibroids: A single intracavitary fibroid is seen in the uterine fundus which measures 2.7 cm in maximum diameter. -- Pedunculated fibroids: Largest pedunculated fibroid is seen in the uterine fundus which measures 4.0 cm in diameter, and has a broad myometrial pedicle of attachment measuring 3.0 cm in thickness. At least 5 other smaller pedunculated fibroids are seen, however each have a broad myometrial pedicle of attachment to the uterus. -- Fibroid contrast enhancement: All fibroids show contrast enhancement, without significant degeneration/devascularization. -- Right ovary:  Appears normal.  No mass identified. -- Left ovary:  Appears normal.  No mass identified. Other: No abnormal free fluid. Musculoskeletal:  Unremarkable. IMPRESSION: Diffuse uterine involvement by numerous fibroids, as described above, largest measuring 5.6 cm in maximum diameter. 2.7 cm intracavitary fibroid in the fundal portion of the endometrial cavity. Multiple pedunculated fibroids, all of which have broad myometrial pedicles of attachment to the uterus. Normal appearance of both ovaries. No adnexal mass identified. Electronically Signed   By: Marlaine Hind M.D.   On: 06/24/2021 11:56    Assessment and plan- Patient is a 55 y.o. female referred for acute left lower extremity DVT and subsegmental PE  Acute DVT/subsegmental PE: Patient is currently on Eliquis since September 2022 which she will continue.  Lysteda does not commonly cause Thromboembolic episodes although it is a local hemostatic agent and a fibrinolysis inhibitor.  DVTs were noted in postmarketing cases with Lysteda but not a common side effect.  It is therefore unclear  if Lysteda was truly the provoking agent for her DVT and PE.  I would like her to remain on anticoagulation for at least 6 months.  Following that I will obtain a D-dimer level and see if we can potentially get her off anticoagulation.  Her other risk factors for recurrent DVT/PE also include her obesity.  We will order hypercoagulable work-up at this time  Iron deficiency anemia:Secondary to heavy menstrual bleeding last H&H was 8.4 about 3 weeks ago ferritin levels were normal at 135 but patient had also received blood transfusion.  I will repeat ferritin and iron studies in 2 months  Breast mass: CT angio chest also showed 2.9 cm right breast mass.  Patient states that she is following up with [facility] and has undergone biopsies in the past a year ago as well which showed fibroadenoma.  No follow-up needed from my side for this.  Uterine fibroids: Currently following with GYN as well as GYN oncology.  They had recommended Lupron injections and she will receive her first shot today and subsequent injections to be coordinated by GYN oncology.   Thank you for this kind referral and the opportunity to participate in the care of this patient   Visit Diagnosis 1. Iron deficiency anemia, unspecified iron deficiency anemia type   2. Acute pulmonary embolism without acute cor pulmonale, unspecified pulmonary embolism type ([place])   3. Uterine leiomyoma, unspecified location     Dr. Randa Evens, MD, MPH [facility] at [facility] [phone] 06/28/2021

## 2021-06-28 NOTE — Progress Notes (Signed)
Patient stated that after looking up side effects on Lupron injection she changes her mind and does not want injection scheduled for today. Patient stated she will await for next appointment to determine then. Patient discharged, no questions at this time.

## 2021-06-28 NOTE — Progress Notes (Signed)
Patient here for initial oncology appointment, concerns of stomach cramps

## 2021-06-30 ENCOUNTER — Other Ambulatory Visit: Payer: Self-pay | Admitting: Family Medicine

## 2021-06-30 DIAGNOSIS — N6312 Unspecified lump in the right breast, upper inner quadrant: Secondary | ICD-10-CM

## 2021-07-01 ENCOUNTER — Encounter: Payer: Self-pay | Admitting: Nurse Practitioner

## 2021-07-07 ENCOUNTER — Inpatient Hospital Stay
Admission: RE | Admit: 2021-07-07 | Discharge: 2021-07-07 | Disposition: A | Discharge status: Home / Self Care | Payer: Self-pay | Source: Ambulatory Visit | Attending: *Deleted | Admitting: *Deleted

## 2021-07-07 ENCOUNTER — Other Ambulatory Visit: Payer: Self-pay | Admitting: *Deleted

## 2021-07-07 DIAGNOSIS — Z1231 Encounter for screening mammogram for malignant neoplasm of breast: Secondary | ICD-10-CM

## 2021-07-19 ENCOUNTER — Ambulatory Visit
Admission: RE | Admit: 2021-07-19 | Discharge: 2021-07-19 | Disposition: A | Discharge status: Home / Self Care | Payer: Managed Care, Other (non HMO) | Source: Ambulatory Visit | Attending: Family Medicine | Admitting: Family Medicine

## 2021-07-19 ENCOUNTER — Other Ambulatory Visit: Payer: Managed Care, Other (non HMO)

## 2021-07-19 ENCOUNTER — Other Ambulatory Visit: Payer: Self-pay

## 2021-07-19 DIAGNOSIS — N6312 Unspecified lump in the right breast, upper inner quadrant: Secondary | ICD-10-CM | POA: Diagnosis present

## 2021-07-29 ENCOUNTER — Inpatient Hospital Stay: Payer: Managed Care, Other (non HMO)

## 2021-07-29 ENCOUNTER — Other Ambulatory Visit: Payer: Self-pay

## 2021-07-29 ENCOUNTER — Inpatient Hospital Stay: Payer: Managed Care, Other (non HMO) | Attending: Oncology

## 2021-07-29 DIAGNOSIS — D259 Leiomyoma of uterus, unspecified: Secondary | ICD-10-CM | POA: Insufficient documentation

## 2021-07-29 DIAGNOSIS — D509 Iron deficiency anemia, unspecified: Secondary | ICD-10-CM

## 2021-07-29 DIAGNOSIS — I2699 Other pulmonary embolism without acute cor pulmonale: Secondary | ICD-10-CM

## 2021-07-29 LAB — CBC WITH DIFFERENTIAL/PLATELET
Abs Immature Granulocytes: 0.01 10*3/uL (ref 0.00–0.07)
Basophils Absolute: 0 10*3/uL (ref 0.0–0.1)
Basophils Relative: 1 %
Eosinophils Absolute: 0.1 10*3/uL (ref 0.0–0.5)
Eosinophils Relative: 2 %
HCT: 34.8 % — ABNORMAL LOW (ref 36.0–46.0)
Hemoglobin: 10.1 g/dL — ABNORMAL LOW (ref 12.0–15.0)
Immature Granulocytes: 0 %
Lymphocytes Relative: 30 %
Lymphs Abs: 1.6 10*3/uL (ref 0.7–4.0)
MCH: 22 pg — ABNORMAL LOW (ref 26.0–34.0)
MCHC: 29 g/dL — ABNORMAL LOW (ref 30.0–36.0)
MCV: 75.8 fL — ABNORMAL LOW (ref 80.0–100.0)
Monocytes Absolute: 0.3 10*3/uL (ref 0.1–1.0)
Monocytes Relative: 6 %
Neutro Abs: 3.2 10*3/uL (ref 1.7–7.7)
Neutrophils Relative %: 61 %
Platelets: 293 10*3/uL (ref 150–400)
RBC: 4.59 MIL/uL (ref 3.87–5.11)
RDW: 20 % — ABNORMAL HIGH (ref 11.5–15.5)
WBC: 5.2 10*3/uL (ref 4.0–10.5)
nRBC: 0 % (ref 0.0–0.2)

## 2021-07-29 LAB — ANTITHROMBIN III: AntiThromb III Func: 91 % (ref 75–120)

## 2021-07-29 LAB — FERRITIN: Ferritin: 8 ng/mL — ABNORMAL LOW (ref 11–307)

## 2021-07-29 LAB — IRON AND TIBC
Iron: 27 ug/dL — ABNORMAL LOW (ref 28–170)
Saturation Ratios: 7 % — ABNORMAL LOW (ref 10.4–31.8)
TIBC: 396 ug/dL (ref 250–450)
UIBC: 369 ug/dL

## 2021-07-29 MED ORDER — LEUPROLIDE ACETATE 3.75 MG IM KIT
3.7500 mg | PACK | Freq: Once | INTRAMUSCULAR | Status: AC
  Administered 2021-07-29: 3.75 mg via INTRAMUSCULAR
  Filled 2021-07-29: qty 3.75

## 2021-07-30 LAB — PROTEIN C, TOTAL: Protein C, Total: 104 % (ref 60–150)

## 2021-07-30 LAB — HEX PHASE PHOSPHOLIPID REFLEX

## 2021-07-30 LAB — HEXAGONAL PHASE PHOSPHOLIPID: Hex Phosph Neut Test: 4 s (ref 0–11)

## 2021-07-30 LAB — LUPUS ANTICOAGULANT
DRVVT: 33.1 s (ref 0.0–47.0)
PTT Lupus Anticoagulant: 35 s (ref 0.0–51.9)
Thrombin Time: 18.1 s (ref 0.0–23.0)
dPT Confirm Ratio: 0.88 Ratio (ref 0.00–1.34)
dPT: 30.8 s (ref 0.0–47.6)

## 2021-07-30 LAB — PROTEIN S PANEL
Protein S Activity: 105 % (ref 63–140)
Protein S Ag, Free: 92 % (ref 61–136)
Protein S Ag, Total: 126 % (ref 60–150)

## 2021-07-31 LAB — BETA-2-GLYCOPROTEIN I ABS, IGG/M/A
Beta-2 Glyco I IgG: <9 GPI IgG units (ref 0–20)
Beta-2-Glycoprotein I IgA: <9 GPI IgA units (ref 0–25)
Beta-2-Glycoprotein I IgM: <9 GPI IgM units (ref 0–32)

## 2021-08-02 LAB — CARDIOLIPIN ANTIBODIES, IGG, IGM, IGA
Anticardiolipin IgA: <9 APL U/mL (ref 0–11)
Anticardiolipin IgG: <9 GPL U/mL (ref 0–14)
Anticardiolipin IgM: <9 MPL U/mL (ref 0–12)

## 2021-08-05 LAB — PROTHROMBIN GENE MUTATION

## 2021-08-08 LAB — FACTOR 5 LEIDEN

## 2021-08-15 ENCOUNTER — Inpatient Hospital Stay: Payer: Managed Care, Other (non HMO) | Attending: Oncology | Admitting: Oncology

## 2021-08-15 ENCOUNTER — Encounter: Payer: Self-pay | Admitting: Oncology

## 2021-10-13 ENCOUNTER — Other Ambulatory Visit: Payer: Self-pay | Admitting: Student

## 2021-10-13 ENCOUNTER — Ambulatory Visit
Admission: RE | Admit: 2021-10-13 | Discharge: 2021-10-13 | Disposition: A | Discharge status: Home / Self Care | Payer: Managed Care, Other (non HMO) | Source: Ambulatory Visit | Attending: Student | Admitting: Student

## 2021-10-13 DIAGNOSIS — R52 Pain, unspecified: Secondary | ICD-10-CM

## 2021-11-29 ENCOUNTER — Emergency Department
Admission: EM | Admit: 2021-11-29 | Discharge: 2021-11-29 | Disposition: A | Discharge status: Home / Self Care | Payer: Managed Care, Other (non HMO) | Attending: Emergency Medicine | Admitting: Emergency Medicine

## 2021-11-29 ENCOUNTER — Encounter: Payer: Self-pay | Admitting: *Deleted

## 2021-11-29 ENCOUNTER — Other Ambulatory Visit: Payer: Self-pay

## 2021-11-29 ENCOUNTER — Emergency Department: Payer: Managed Care, Other (non HMO)

## 2021-11-29 DIAGNOSIS — E119 Type 2 diabetes mellitus without complications: Secondary | ICD-10-CM | POA: Insufficient documentation

## 2021-11-29 DIAGNOSIS — Z7901 Long term (current) use of anticoagulants: Secondary | ICD-10-CM | POA: Diagnosis not present

## 2021-11-29 DIAGNOSIS — M7989 Other specified soft tissue disorders: Secondary | ICD-10-CM | POA: Diagnosis present

## 2021-11-29 DIAGNOSIS — L03116 Cellulitis of left lower limb: Secondary | ICD-10-CM | POA: Diagnosis not present

## 2021-11-29 DIAGNOSIS — I10 Essential (primary) hypertension: Secondary | ICD-10-CM | POA: Diagnosis not present

## 2021-11-29 LAB — CBC WITH DIFFERENTIAL/PLATELET
Abs Immature Granulocytes: 0.02 10*3/uL (ref 0.00–0.07)
Basophils Absolute: 0 10*3/uL (ref 0.0–0.1)
Basophils Relative: 0 %
Eosinophils Absolute: 0.2 10*3/uL (ref 0.0–0.5)
Eosinophils Relative: 3 %
HCT: 30.4 % — ABNORMAL LOW (ref 36.0–46.0)
Hemoglobin: 8.8 g/dL — ABNORMAL LOW (ref 12.0–15.0)
Immature Granulocytes: 0 %
Lymphocytes Relative: 29 %
Lymphs Abs: 1.9 10*3/uL (ref 0.7–4.0)
MCH: 21.3 pg — ABNORMAL LOW (ref 26.0–34.0)
MCHC: 28.9 g/dL — ABNORMAL LOW (ref 30.0–36.0)
MCV: 73.6 fL — ABNORMAL LOW (ref 80.0–100.0)
Monocytes Absolute: 0.5 10*3/uL (ref 0.1–1.0)
Monocytes Relative: 7 %
Neutro Abs: 4 10*3/uL (ref 1.7–7.7)
Neutrophils Relative %: 61 %
Platelets: 320 10*3/uL (ref 150–400)
RBC: 4.13 MIL/uL (ref 3.87–5.11)
RDW: 19 % — ABNORMAL HIGH (ref 11.5–15.5)
WBC: 6.6 10*3/uL (ref 4.0–10.5)
nRBC: 0 % (ref 0.0–0.2)

## 2021-11-29 LAB — LACTIC ACID, PLASMA: Lactic Acid, Venous: 1.1 mmol/L (ref 0.5–1.9)

## 2021-11-29 LAB — CBG MONITORING, ED: Glucose-Capillary: 129 mg/dL — ABNORMAL HIGH (ref 70–99)

## 2021-11-29 MED ORDER — SODIUM CHLORIDE 0.9 % IV BOLUS: 1000.0000 mL | Freq: Once | INTRAVENOUS | Status: DC

## 2021-11-29 MED ORDER — CEPHALEXIN 500 MG PO CAPS: 500.0000 mg | ORAL_CAPSULE | Freq: Two times a day (BID) | ORAL | 0 refills | Status: AC

## 2021-11-29 MED ORDER — SODIUM CHLORIDE 0.9 % IV SOLN
2.0000 g | Freq: Once | INTRAVENOUS | Status: AC
  Administered 2021-11-29: 2 g via INTRAVENOUS
  Filled 2021-11-29: qty 20

## 2021-11-29 MED ORDER — ACETAMINOPHEN 500 MG PO TABS
1000.0000 mg | ORAL_TABLET | Freq: Once | ORAL | Status: AC
  Administered 2021-11-29: 1000 mg via ORAL
  Filled 2021-11-29: qty 2

## 2021-11-29 MED ORDER — DOXYCYCLINE MONOHYDRATE 100 MG PO TABS: 100.0000 mg | ORAL_TABLET | Freq: Two times a day (BID) | ORAL | 0 refills | Status: AC

## 2021-11-29 MED ORDER — SODIUM CHLORIDE 0.9 % IV SOLN: 2.0000 g | Freq: Once | INTRAVENOUS | Status: DC

## 2021-11-29 NOTE — Discharge Instructions (Signed)
At this time I suspect that this could just be some cellulitis however if you develop fevers or the redness or pain is spreading that you need to return to the ER for repeat evaluation to make sure that nothing deeper is going on.  You can take Tylenol 1 g every 8 hours to help with any pain. ?

## 2021-11-29 NOTE — ED Notes (Signed)
Pt hard stick; continuing to assess for second IV placement and rest of blood-work collection.  ?

## 2021-11-29 NOTE — ED Notes (Signed)
EDP Funke to bedside; notified unable to collect 2nd set of cultures as pt hard stick and refusing currently; EDP Funke verbal okay to go ahead and hang rocephin.  ?

## 2021-11-29 NOTE — ED Provider Notes (Signed)
? ?[facility] ?Provider Note ? ? ? Event Date/Time  ? First MD Initiated Contact with Patient 11/29/21 1814   ?  (approximate) ? ? ?History  ? ?Leg Pain ? ? ?HPI ? ?Rhonda Mccall is a 56 y.o. female with diabetes, hypertension, hyperlipidemia, prior blood clot on Eliquis in September 2022 who comes in with concerns for leg swelling.  Patient reports that she has had some discomfort for about 1 week in the left leg.  She reports that she has intermittently missed her Eliquis but overall has been pretty consistent with it.  She reports that she had an injury to her right leg that had some pain in it and she was started on some doxycycline for concern for infection.  I reviewed the [place] clinic note from 10/13/2021 where she was started on doxycycline for 7 days and she does report that this helped the area.  She denies any shortness of breath or any other symptoms. ? ?Physical Exam  ? ?Triage Vital Signs: ?ED Triage Vitals  ?Enc Vitals Group  ?   BP 11/29/21 1738 (!) 150/84  ?   Pulse Rate 11/29/21 1738 (!) 110  ?   Resp 11/29/21 1738 20  ?   Temp 11/29/21 1738 98.6 ?F (37 ?C)  ?   Temp Source 11/29/21 1738 Oral  ?   SpO2 11/29/21 1738 100 %  ?   Weight 11/29/21 1738 (!) 313 lb (142 kg)  ?   Height 11/29/21 1738 '5\' 6"'$  (1.676 m)  ?   Head Circumference --   ?   Peak Flow --   ?   Pain Score 11/29/21 1738 4  ?   Pain Loc --   ?   Pain Edu? --   ?   Excl. in [place]? --   ? ? ?Most recent vital signs: ?Vitals:  ? 11/29/21 1738 11/29/21 1738  ?BP:  (!) 150/84  ?Pulse:  (!) 110  ?Resp:  20  ?Temp: 98.6 ?F (37 ?C) 98.4 ?F (36.9 ?C)  ?SpO2:  100%  ? ? ? ?General: Awake, no distress.  ?CV:  Good peripheral perfusion.  ?Resp:  Normal effort. ?Abd:  No distention.  ?Other:  Good distal pulse of the left foot with a little bit of swelling noted in the calf.  Patient has some induration and warmth and cellulitis noted to the lateral side of the left calf.  She had her foot was examined without any evidence of  wounds on the bottom of the foot.  No pain in her thigh.  Her tenderness is exactly where I push where there is the induration noted.  She is no crepitus noted ? ? ?ED Results / Procedures / Treatments  ? ?Labs ?(all labs ordered are listed, but only abnormal results are displayed) ?Labs Reviewed  ?CULTURE, BLOOD (ROUTINE X 2)  ?CULTURE, BLOOD (ROUTINE X 2)  ?CBC WITH DIFFERENTIAL/PLATELET  ?COMPREHENSIVE METABOLIC PANEL  ?LACTIC ACID, PLASMA  ?LACTIC ACID, PLASMA  ? ? ? ?EKG ? ?My interpretation of EKG: ? ?Normal sinus rate of 89, no ST elevation, no T wave inversions, normal intervals ? ?RADIOLOGY ?I have reviewed the  us-no of any DVT ? ?PROCEDURES: ? ?Critical Care performed: No ? ?Procedures ? ? ?MEDICATIONS ORDERED IN ED: ?Medications  ?cefTRIAXone (ROCEPHIN) 2 g in sodium chloride 0.9 % 100 mL IVPB (0 g Intravenous Stopped 11/29/21 2100)  ?acetaminophen (TYLENOL) tablet 1,000 mg (1,000 mg Oral Given 11/29/21 2039)  ? ? ? ?IMPRESSION /  MDM / ASSESSMENT AND PLAN / ED COURSE  ?I reviewed the triage vital signs and the nursing notes. ?             ?               ? ?Patient comes in with tachycardia with left leg swelling with ultrasound that was ordered from triage that was negative for DVT.  No evidence of any abscess.  On my examination I am concerned that she could have cellulitis.  Patient very tachycardic she is diabetic making her high risk for sepsis therefore will get blood cultures, lactate and basic labs and give patient some IV ceftriaxone.  Do not feel like this represents necrotizing fasciitis given her reassuring examination without crepitus and the fact this been going on slowly progressing over the past week. ? ?Lactate is normal.  CBC shows low hemoglobin but patient does report having a history of this.  Her white count is normal.  Her CMP unfortunately hemolyzed and they are having a lot of difficulty getting any repeat blood and a repeat blood culture.  Her heart rate to come down to 88 she  reports feeling much better we discussed rechecking her CMP but patient would like to be able to go home given that things are getting better.  We decided to do a glucose check just to make sure that there is no evidence of severe hyperglycemia that could be contributing to her initial tachycardia.  Her recheck glucose was normal at 129.  We have given her a dose of ceftriaxone to cover her.  She declines repeat/second blood culture.  She does not meet any sepsis criteria given the white count was normal therefore will discharge patient with Keflex and Doxy to cover for cellulitis and she can return if she develops fevers, worsening swelling.  Do not feel there is any abscess on that leg.  She understands return precautions. ? ?I discussed the provisional nature of ED diagnosis, the treatment so far, the ongoing plan of care, follow up appointments and return precautions with the patient and any family or support people present. They expressed understanding and agreed with the plan, discharged home. ? ? ? ? ? ?FINAL CLINICAL IMPRESSION(S) / ED DIAGNOSES  ? ?Final diagnoses:  ?Cellulitis of left lower extremity  ? ? ? ?Rx / DC Orders  ? ?ED Discharge Orders   ? ?      Ordered  ?  doxycycline (ADOXA) 100 MG tablet  2 times daily       ? 11/29/21 2217  ?  cephALEXin (KEFLEX) 500 MG capsule  2 times daily       ? 11/29/21 2217  ? ?  ?  ? ?  ? ? ? ?Note:  This document was prepared using Dragon voice recognition software and may include unintentional dictation errors. ?  ?Vanessa [place], MD ?11/29/21 2219 ? ?

## 2021-11-29 NOTE — ED Triage Notes (Signed)
Pt has left leg pain.  Pt has swelling in the left leg.  No known injury.  Pt sent from [facility] for eval. No sob or chest pain.  Pt alert  speech clear.   ?

## 2021-11-29 NOTE — ED Notes (Signed)
Unable to pull enough blood back from L 22g AC IV for blood cultures but blood return noted. Pt refusing phlebotomy involvement to collet 2nd set of cultures; pt states she doesn't want to be stuck again.  ?

## 2021-12-04 LAB — CULTURE, BLOOD (ROUTINE X 2)
Culture: NO GROWTH
Special Requests: ADEQUATE

## 2021-12-07 ENCOUNTER — Other Ambulatory Visit: Payer: Managed Care, Other (non HMO)

## 2021-12-07 ENCOUNTER — Other Ambulatory Visit: Payer: Self-pay | Admitting: Family Medicine

## 2021-12-07 DIAGNOSIS — M79605 Pain in left leg: Secondary | ICD-10-CM

## 2021-12-07 DIAGNOSIS — M7989 Other specified soft tissue disorders: Secondary | ICD-10-CM

## 2021-12-08 ENCOUNTER — Ambulatory Visit
Admission: RE | Admit: 2021-12-08 | Discharge: 2021-12-08 | Disposition: A | Discharge status: Home / Self Care | Payer: Managed Care, Other (non HMO) | Source: Ambulatory Visit | Attending: Family Medicine | Admitting: Family Medicine

## 2021-12-08 DIAGNOSIS — M7989 Other specified soft tissue disorders: Secondary | ICD-10-CM

## 2021-12-08 DIAGNOSIS — M79605 Pain in left leg: Secondary | ICD-10-CM

## 2021-12-27 ENCOUNTER — Encounter (INDEPENDENT_AMBULATORY_CARE_PROVIDER_SITE_OTHER): Payer: Self-pay | Admitting: Nurse Practitioner

## 2022-01-23 IMAGING — MG DIGITAL DIAGNOSTIC BILAT W/ TOMO W/ CAD
8 series · 8 of 24 positions shown · non-contrast
Comparison: Previous exam(s)., CT dated May 13, 2021

CLINICAL DATA: Patient is status post 3 site stereotactic guided
biopsy for calcifications with benign results. Patient had a CT in
May 13, 2021 which demonstrated a RIGHT retroareolar mass.

EXAM:
DIGITAL DIAGNOSTIC BILATERAL MAMMOGRAM WITH TOMOSYNTHESIS AND CAD;
ULTRASOUND RIGHT BREAST LIMITED
TECHNIQUE: Bilateral digital diagnostic mammography and breast tomosynthesis
was performed. The images were evaluated with computer-aided
detection.; Targeted ultrasound examination of the right breast was
performed

[L CC synth-2D]
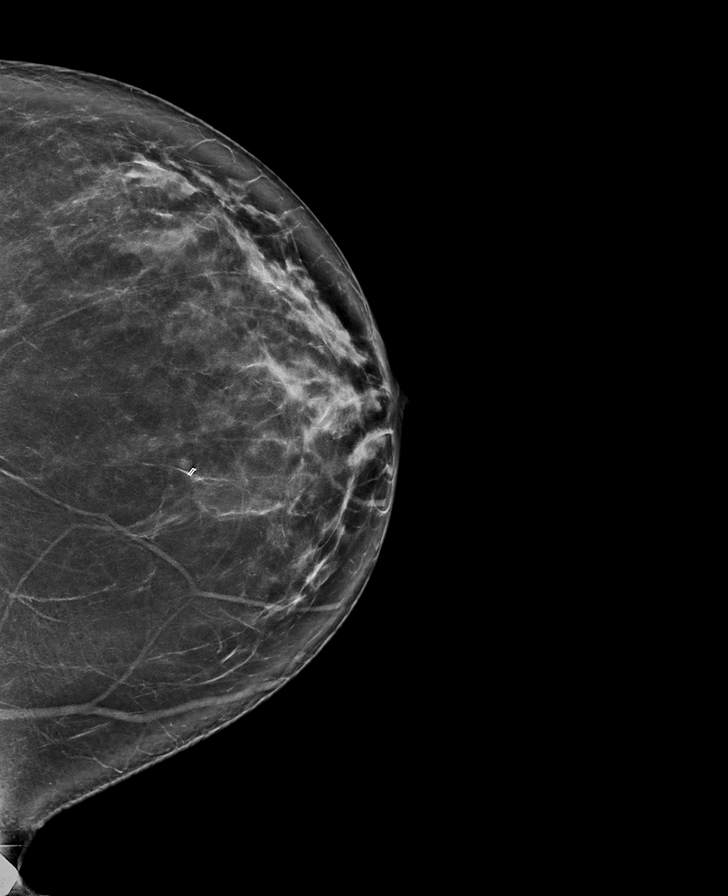

[L MLO synth-2D]
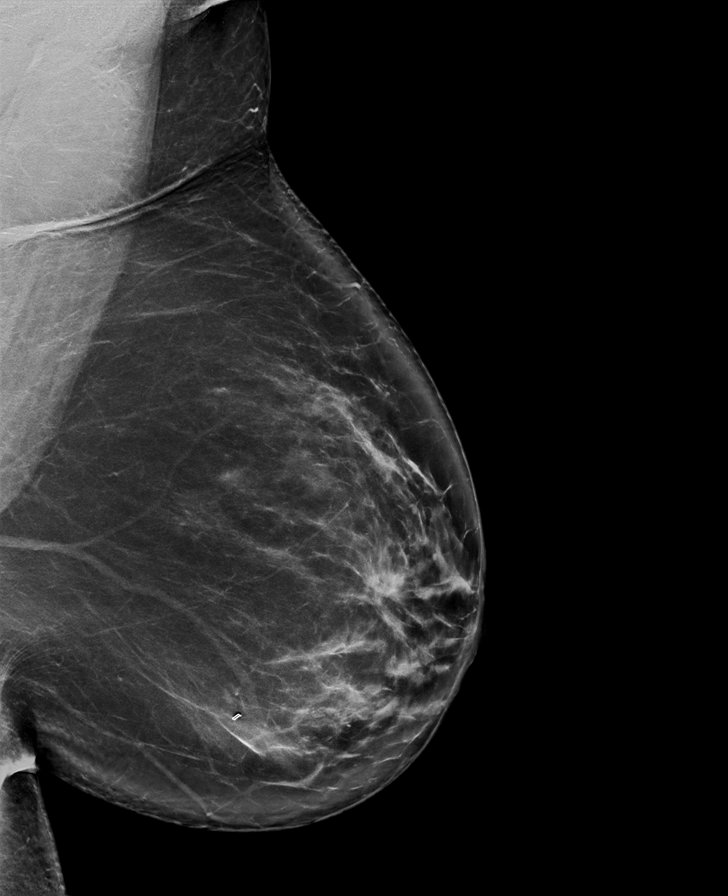

[R MLO synth-2D]
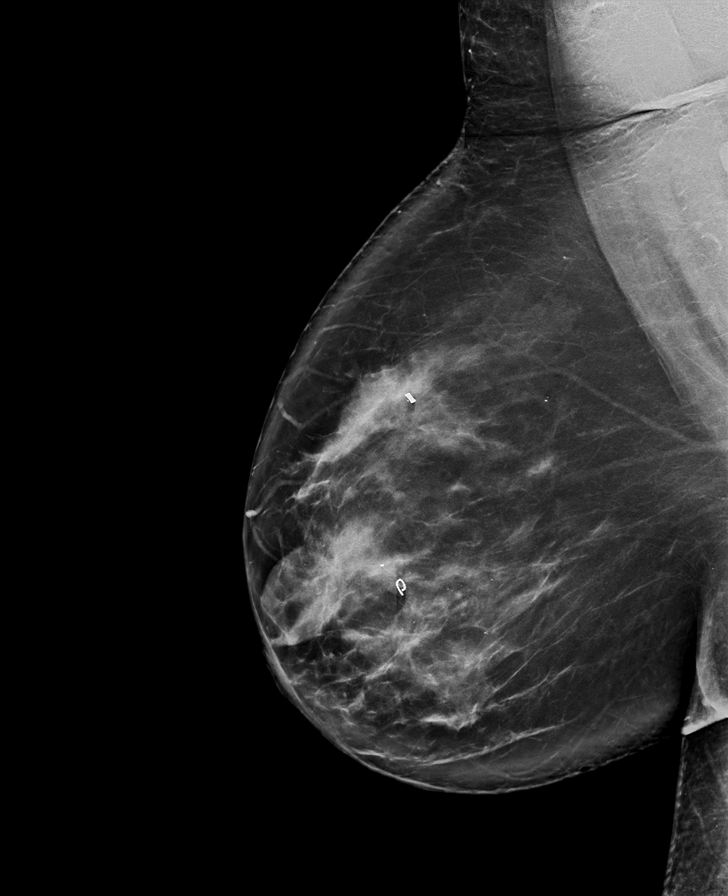

[R CC synth-2D]
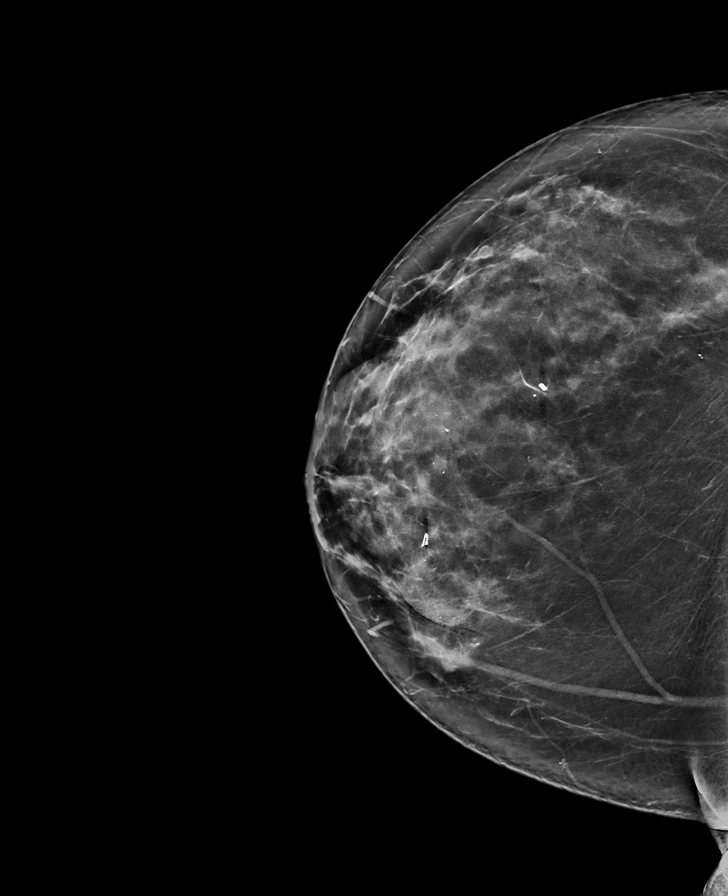

[L MLO tomo · tomo slice 52/103.0]
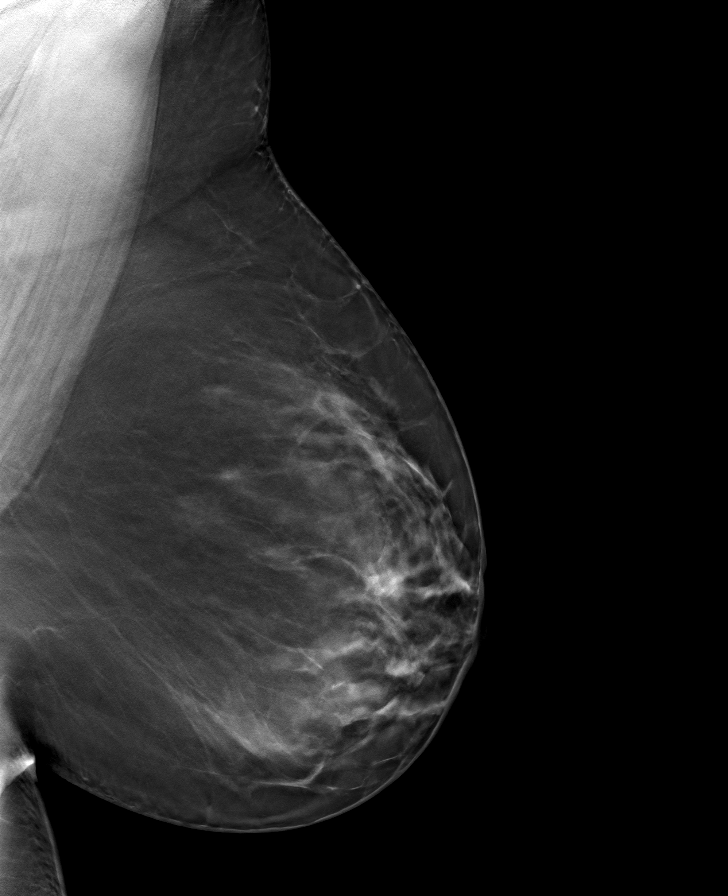

[L CC tomo · tomo slice 45/90.0]
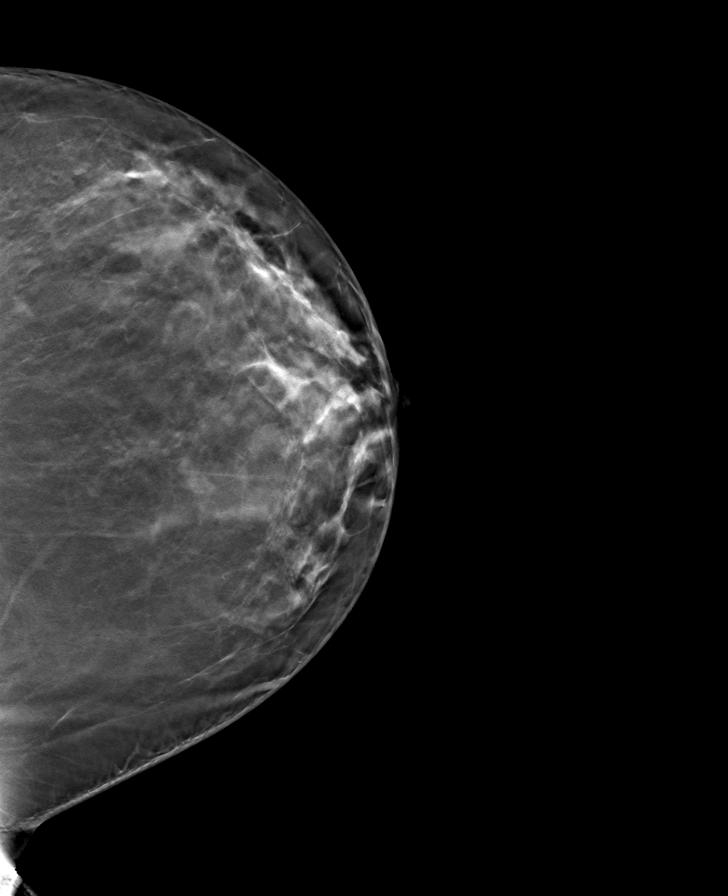

[R MLO tomo · tomo slice 53/104.0]
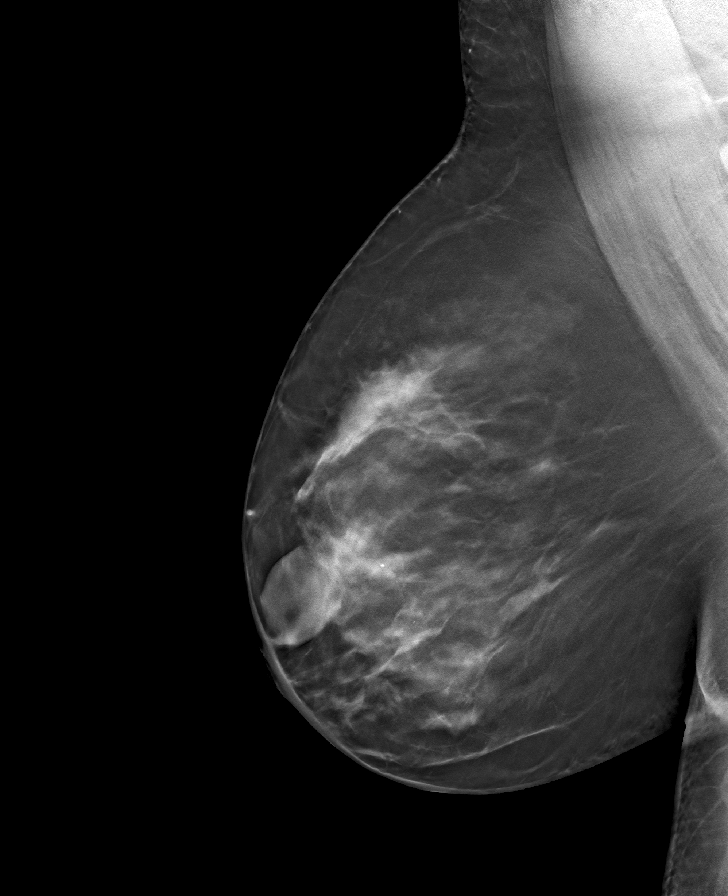

[R CC tomo · tomo slice 43/86.0]
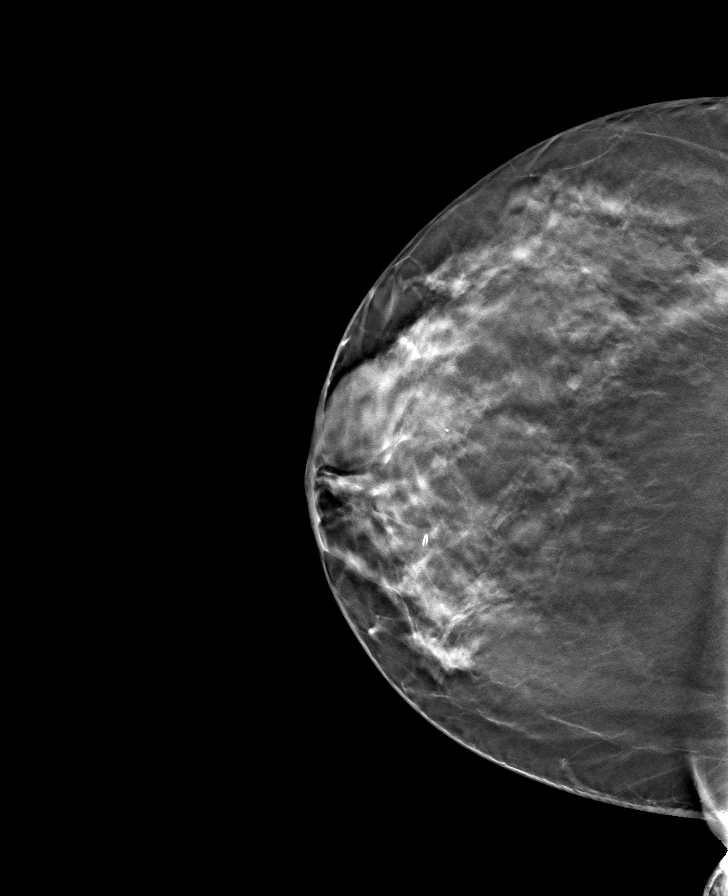

[8 of 24 positions shown; findings below may reference images not displayed]

ACR Breast Density Category c: The breast tissue is heterogeneously
dense, which may obscure small masses.
FINDINGS: Diagnostic images confirm the presence of an oval circumscribed mass
in the RIGHT outer retroareolar breast. This is mammographically
stable in size and appearance dating back to at least 4797,
consistent with a benign etiology. There is a suggestion of internal
fat locules within this mass on tomosynthesis images. Multiple
biopsy clips are noted within the RIGHT breast. A single biopsy clip
is noted in the LEFT breast. No suspicious mass, distortion, or
microcalcifications are identified to suggest presence of malignancy
bilaterally. There are multiple bilateral round and oval
circumscribed masses, most consistent with benign fibroadenomas or
cysts.

On physical exam, no suspicious mass is appreciated.

Targeted ultrasound was performed of the RIGHT outer retroareolar
breast. At 9 o'clock in the retroareolar breast there is an oval
circumscribed predominantly hypoechoic mass which measures 3.2 x
x 2.5 cm. This corresponds to the mass noted on recent CT.
IMPRESSION: 1. There is mammographic stability of a RIGHT retroareolar mass
since at least 4797, consistent with a benign etiology such as a
fibroadenoma or hamartoma. This corresponds to the mass noted on
recent CT.
2. No mammographic evidence of malignancy bilaterally.

RECOMMENDATION:
Screening mammogram in one year.(Code:UJ-D-74P)

I have discussed the findings and recommendations with the patient.
If applicable, a reminder letter will be sent to the patient
regarding the next appointment.

BI-RADS CATEGORY  2: Benign.

## 2022-02-27 ENCOUNTER — Other Ambulatory Visit: Payer: Self-pay | Admitting: Family Medicine

## 2022-02-27 DIAGNOSIS — Z1231 Encounter for screening mammogram for malignant neoplasm of breast: Secondary | ICD-10-CM

## 2022-04-19 IMAGING — US US EXTREM LOW VENOUS*R*
1 series · 13 of 24 positions shown · non-contrast
Comparison: Right lower extremity venous upper
ultrasound-05/14/2020 (negative)

CLINICAL DATA: Right lower extremity pain and edema. History of
previous DVT, currently on anticoagulation. Evaluate for acute or
chronic DVT.



[Series 1: us extrem low venous*right* · 0.08mm/px · 13 of 37 slices shown]
[im 1/37]
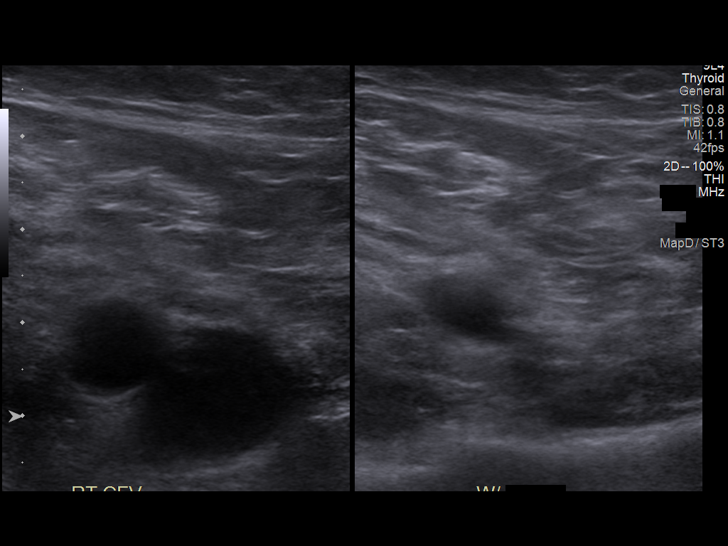
[im 4/37]
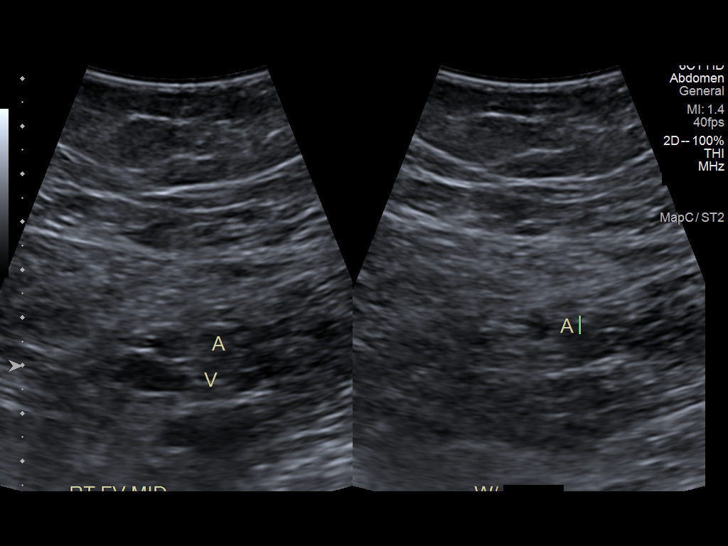
[im 7/37]
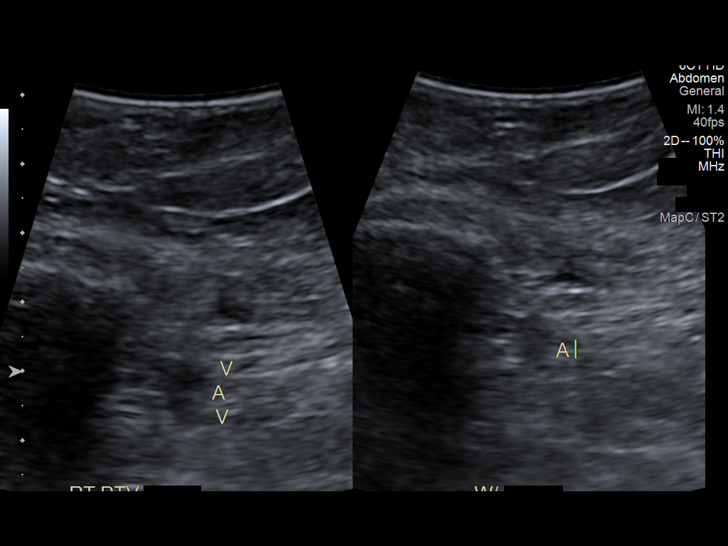
[im 10/37]
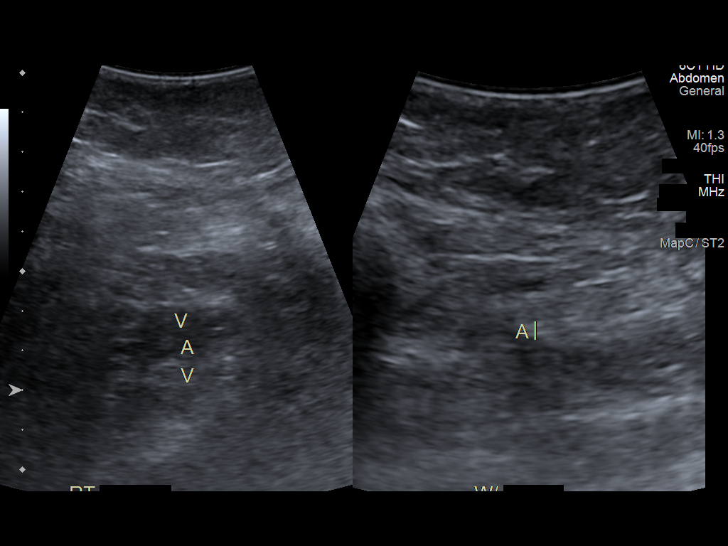
[im 13/37]
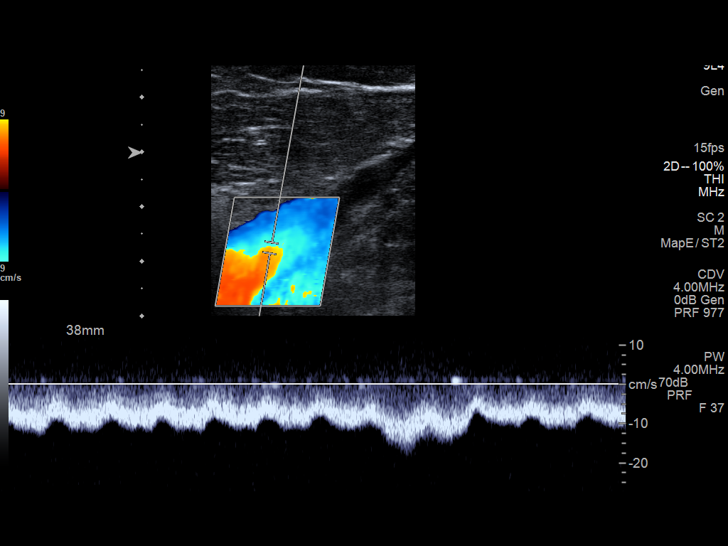
[im 16/37]
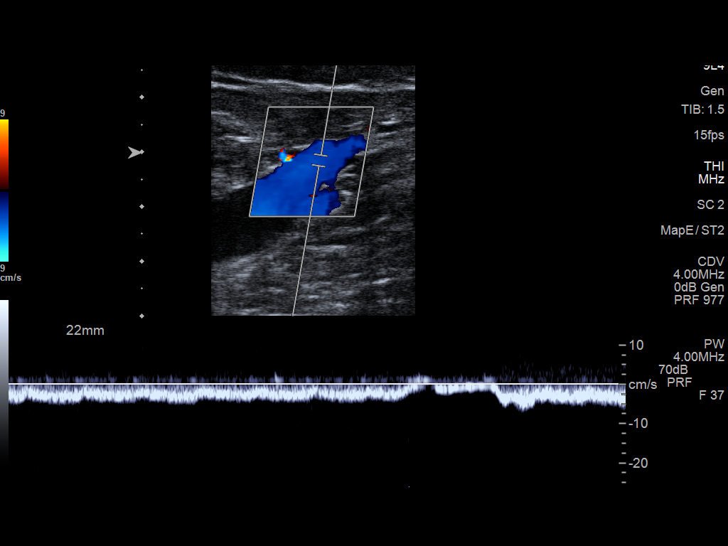
[im 19/37]
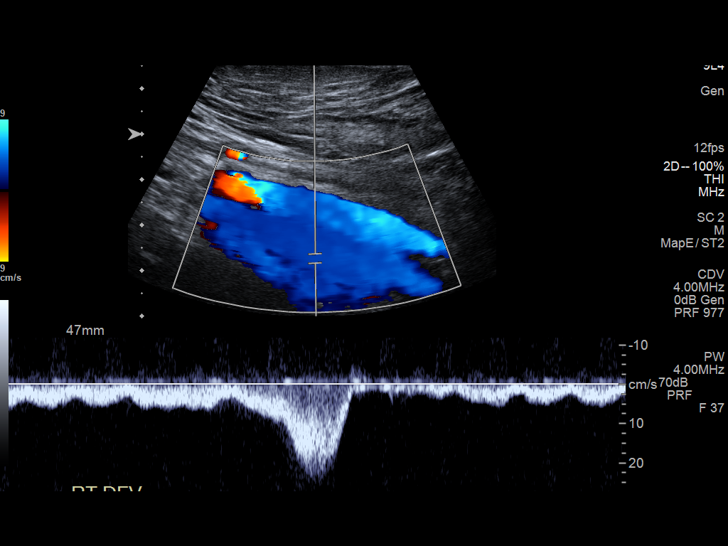
[im 21/37]
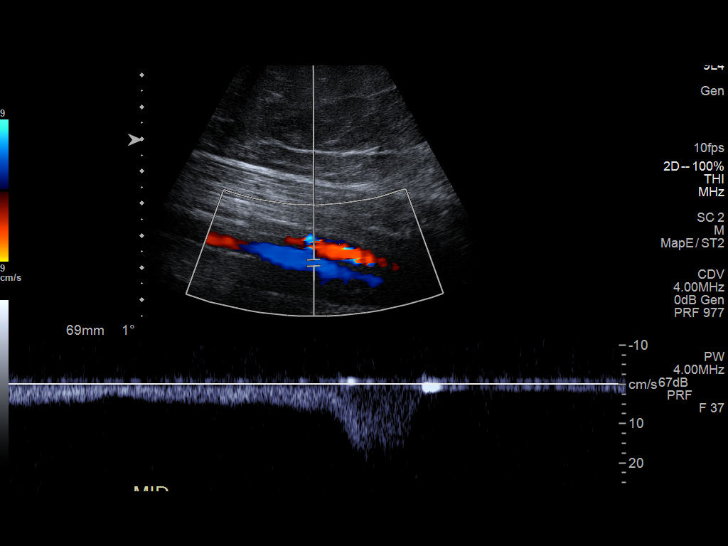
[im 24/37]
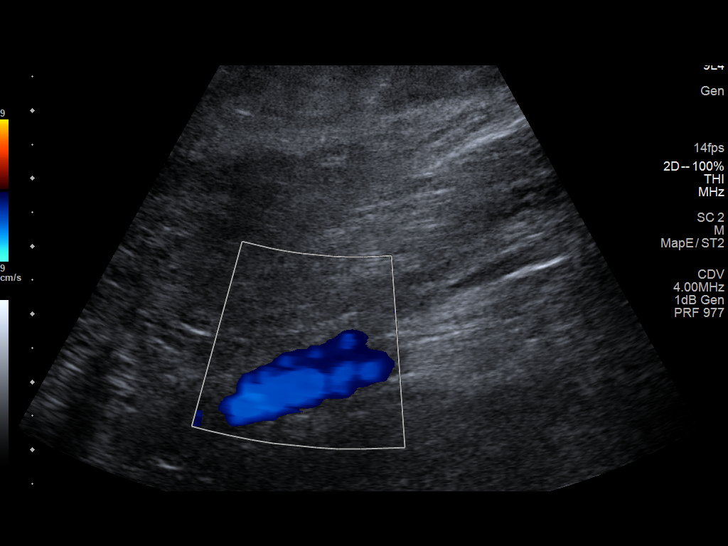
[im 27/37]
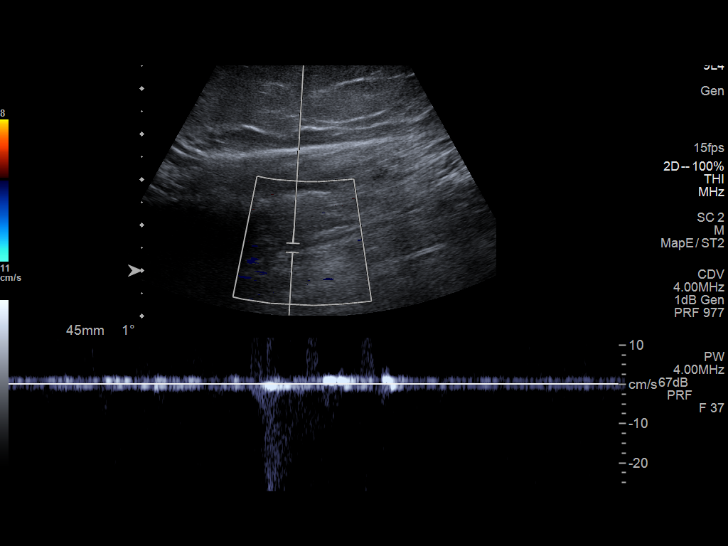
[im 30/37]
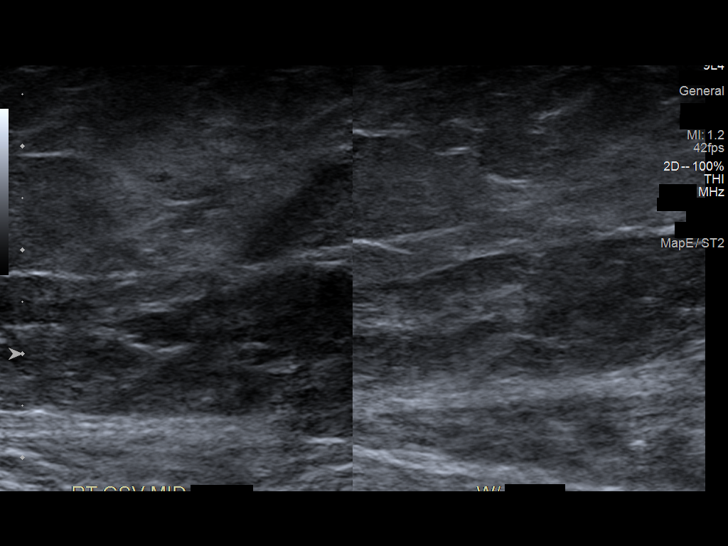
[im 33/37]
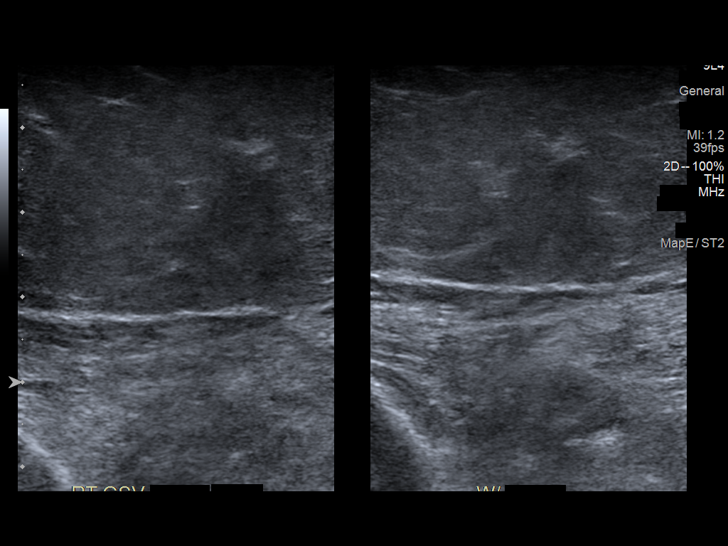
[im 37/37]
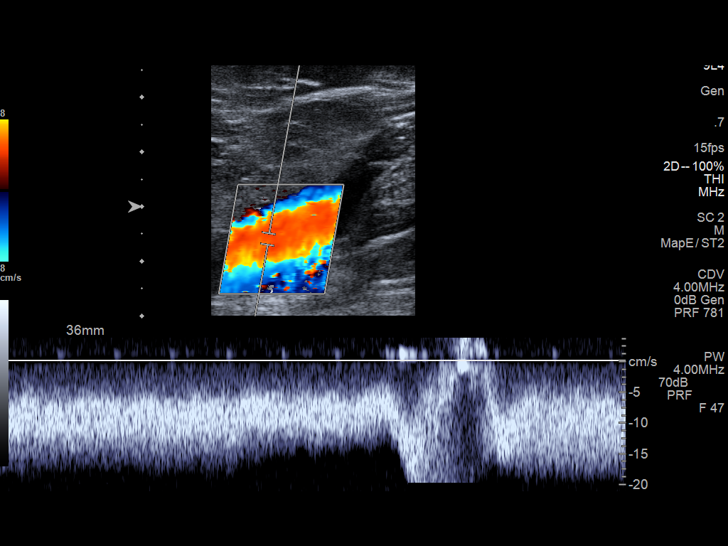

[13 of 24 positions shown; findings below may reference images not displayed]

FINDINGS: Contralateral Common Femoral Vein: Respiratory phasicity is normal
and symmetric with the symptomatic side. No evidence of thrombus.
Normal compressibility.

Common Femoral Vein: No evidence of thrombus. Normal
compressibility, respiratory phasicity and response to augmentation.

Saphenofemoral Junction: No evidence of thrombus. Normal
compressibility and flow on color Doppler imaging.

Profunda Femoral Vein: No evidence of thrombus. Normal
compressibility and flow on color Doppler imaging.

Femoral Vein: No evidence of thrombus. Normal compressibility,
respiratory phasicity and response to augmentation.

Popliteal Vein: No evidence of thrombus. Normal compressibility,
respiratory phasicity and response to augmentation.

Calf Veins: No evidence of thrombus. Normal compressibility and flow
on color Doppler imaging.

Superficial Great Saphenous Vein: No evidence of thrombus. Normal
compressibility.

Venous Reflux:  None.

Other Findings:  None.
IMPRESSION: No evidence of acute or chronic DVT within the right lower
extremity.

## 2022-06-14 IMAGING — US US EXTREM LOW*L* LIMITED
1 series · 8 of 8 positions shown · non-contrast
Comparison: None.

CLINICAL DATA: Left leg pain

EXAM:
ULTRASOUND left LOWER EXTREMITY LIMITED
TECHNIQUE: Ultrasound examination of the lower extremity soft tissues was
performed in the area of clinical concern.

[Series 1: us extrem low*left* limited · 0.07mm/px · 8 acquisitions, 8 frames shown]
[im 1/8]
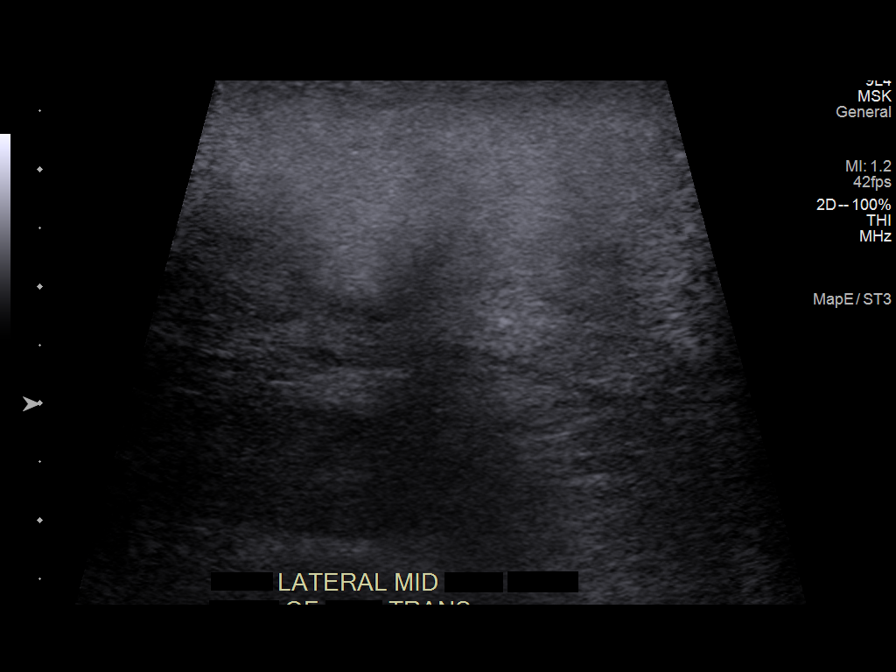
[im 2/8]
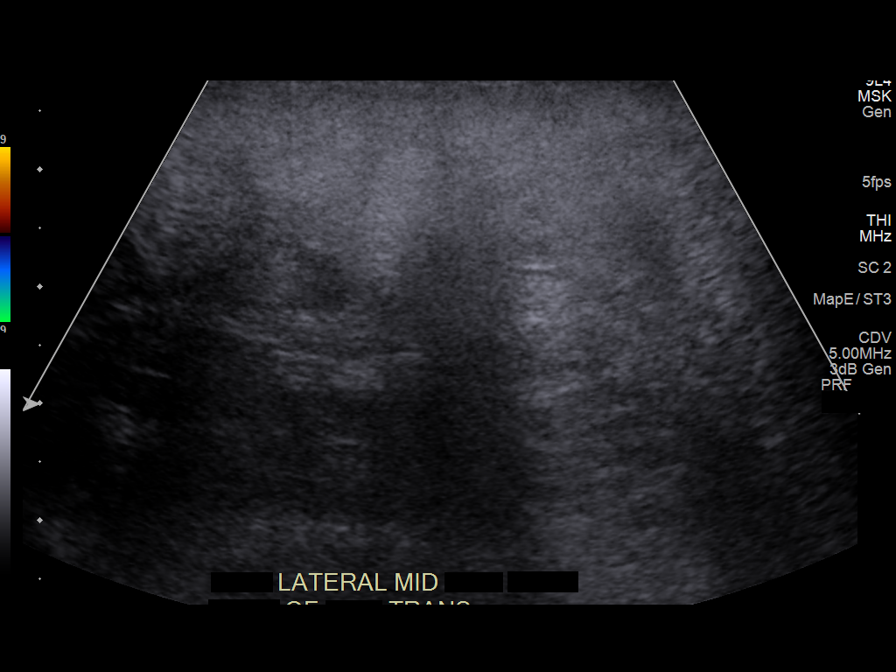
[im 3/8]
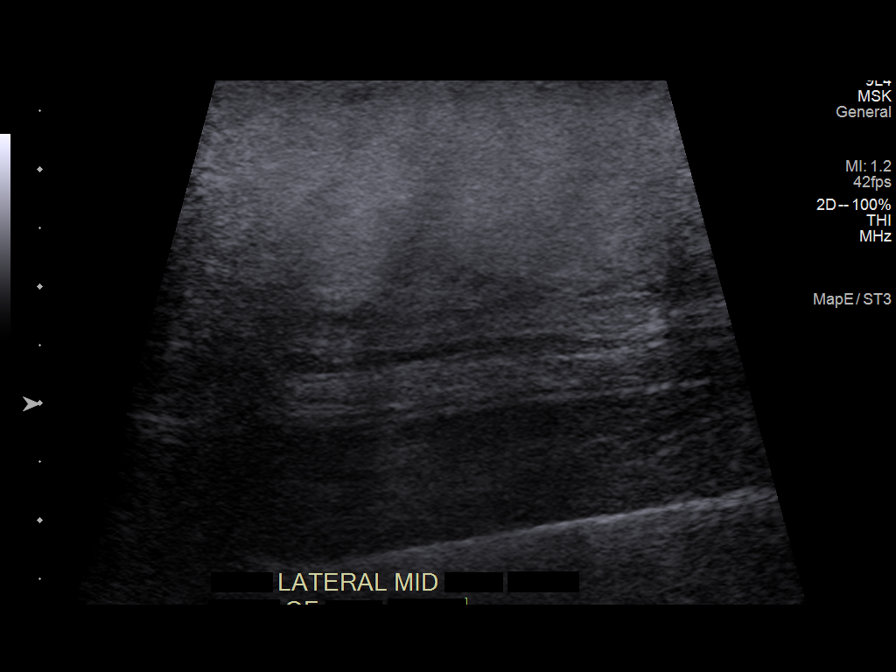
[im 4/8]
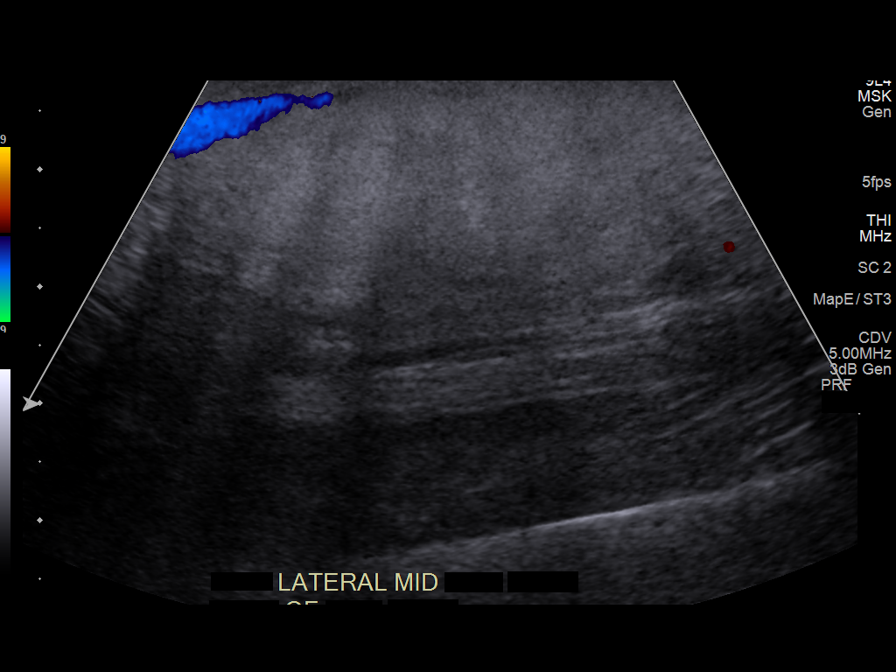
[im 5/8]
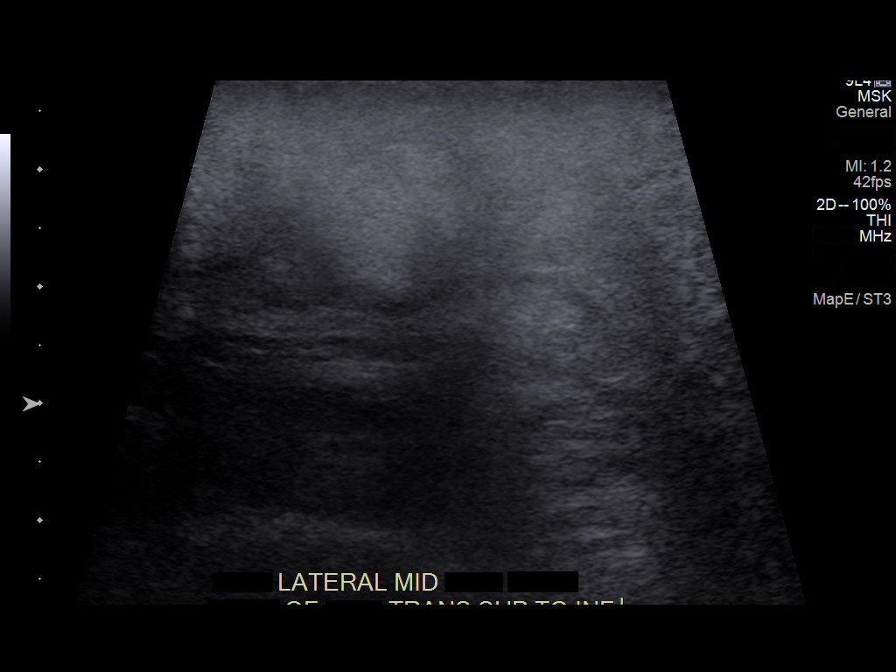
[im 6/8]
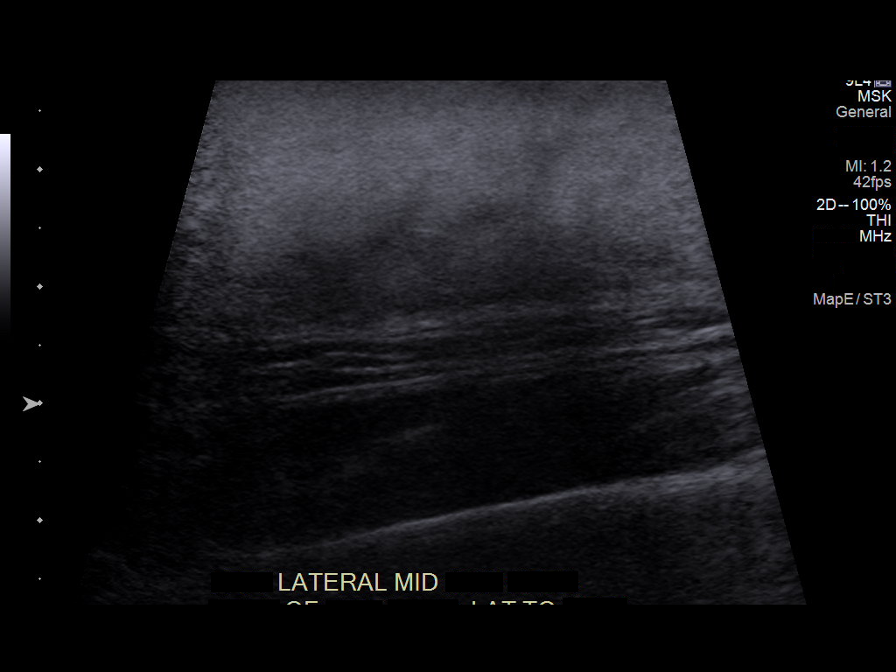
[im 7/8]
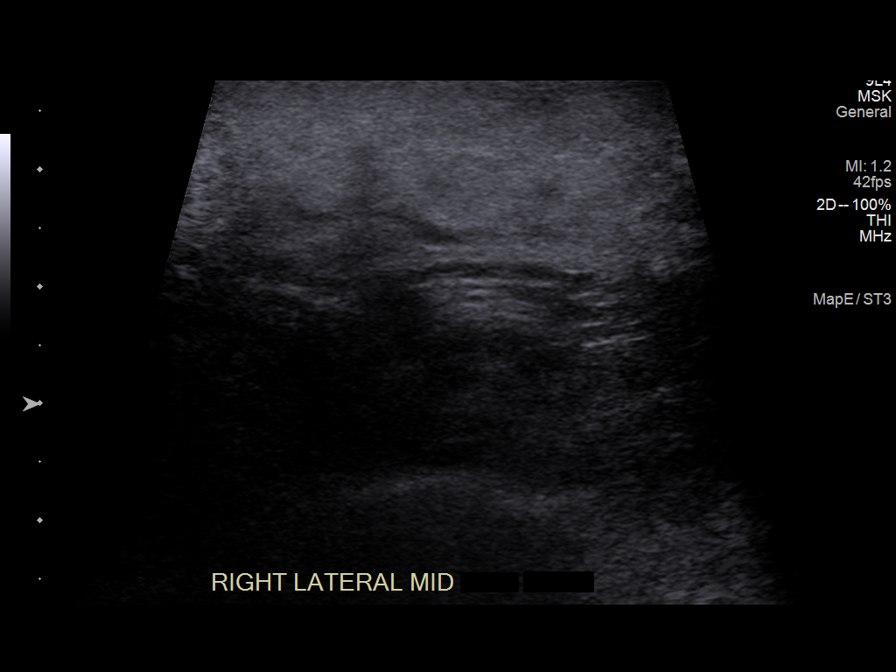
[im 8/8]
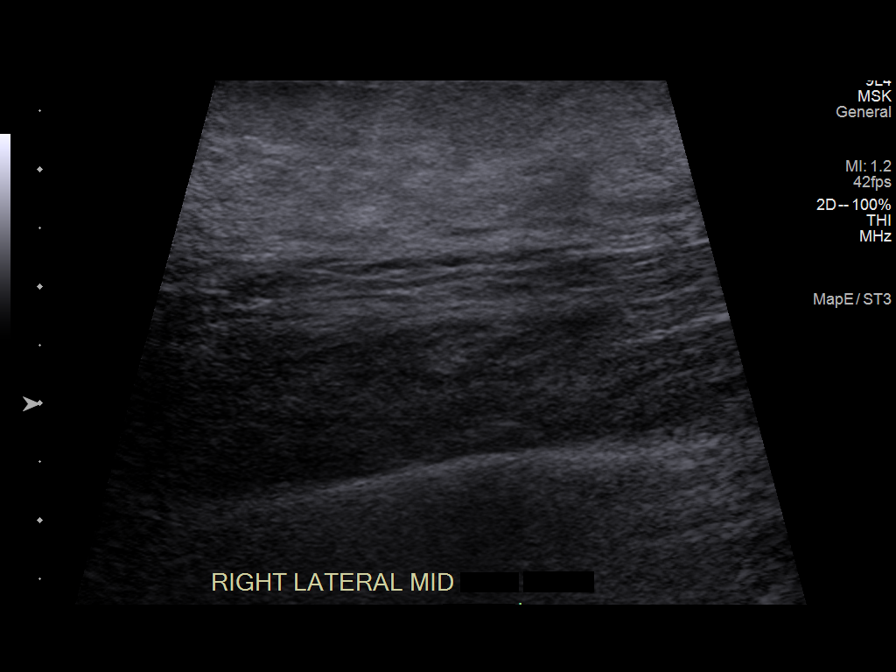

[8 of 8 positions shown; findings below may reference images not displayed]

FINDINGS: No abnormal mass or fluid collection identified in the region of
interest in the left lower leg.
IMPRESSION: As above.

## 2022-07-10 ENCOUNTER — Encounter (INDEPENDENT_AMBULATORY_CARE_PROVIDER_SITE_OTHER): Payer: Self-pay

## 2022-07-21 ENCOUNTER — Ambulatory Visit
Admission: RE | Admit: 2022-07-21 | Discharge: 2022-07-21 | Disposition: A | Discharge status: Home / Self Care | Payer: Managed Care, Other (non HMO) | Source: Ambulatory Visit | Attending: Family Medicine | Admitting: Family Medicine

## 2022-07-21 DIAGNOSIS — Z1231 Encounter for screening mammogram for malignant neoplasm of breast: Secondary | ICD-10-CM | POA: Diagnosis present

## 2022-11-24 ENCOUNTER — Telehealth: Payer: Self-pay

## 2022-11-24 NOTE — Telephone Encounter (Signed)
Received message from Ms Chelf requesting an appointment to discuss hysterectomy with Dr. Theora Gianotti. Her cycles had been very good up until 2/19 and it is currently still going. She has has recent iron studies completed by her PCP and is taking oral medication for iron supplementation. She is having fatigue, nausea and dizziness almost daily. She reports never starting Lupron due to side effects. Appointment arranged for 3/27 with Dr. Theora Gianotti.

## 2022-12-06 ENCOUNTER — Inpatient Hospital Stay: Payer: Managed Care, Other (non HMO) | Attending: Obstetrics and Gynecology | Admitting: Obstetrics and Gynecology

## 2022-12-06 VITALS — BP 157/76 | HR 94 | Temp 97.0°F | Resp 17 | Wt 316.0 lb

## 2022-12-06 DIAGNOSIS — D250 Submucous leiomyoma of uterus: Secondary | ICD-10-CM | POA: Insufficient documentation

## 2022-12-06 DIAGNOSIS — E1159 Type 2 diabetes mellitus with other circulatory complications: Secondary | ICD-10-CM | POA: Insufficient documentation

## 2022-12-06 DIAGNOSIS — D259 Leiomyoma of uterus, unspecified: Secondary | ICD-10-CM | POA: Diagnosis not present

## 2022-12-06 DIAGNOSIS — I2699 Other pulmonary embolism without acute cor pulmonale: Secondary | ICD-10-CM | POA: Diagnosis not present

## 2022-12-06 DIAGNOSIS — R0602 Shortness of breath: Secondary | ICD-10-CM | POA: Diagnosis not present

## 2022-12-06 DIAGNOSIS — D649 Anemia, unspecified: Secondary | ICD-10-CM | POA: Diagnosis not present

## 2022-12-06 DIAGNOSIS — D252 Subserosal leiomyoma of uterus: Secondary | ICD-10-CM | POA: Insufficient documentation

## 2022-12-06 DIAGNOSIS — N946 Dysmenorrhea, unspecified: Secondary | ICD-10-CM | POA: Diagnosis not present

## 2022-12-06 DIAGNOSIS — I10 Essential (primary) hypertension: Secondary | ICD-10-CM | POA: Insufficient documentation

## 2022-12-06 DIAGNOSIS — D251 Intramural leiomyoma of uterus: Secondary | ICD-10-CM | POA: Insufficient documentation

## 2022-12-06 DIAGNOSIS — Z7901 Long term (current) use of anticoagulants: Secondary | ICD-10-CM | POA: Diagnosis not present

## 2022-12-06 DIAGNOSIS — Z86711 Personal history of pulmonary embolism: Secondary | ICD-10-CM | POA: Diagnosis not present

## 2022-12-06 DIAGNOSIS — N921 Excessive and frequent menstruation with irregular cycle: Secondary | ICD-10-CM | POA: Diagnosis present

## 2022-12-06 DIAGNOSIS — D509 Iron deficiency anemia, unspecified: Secondary | ICD-10-CM

## 2022-12-06 NOTE — Progress Notes (Signed)
Patient here for gyncology oncology appointment concerns of Nausea, abdominal pain, SOB and fatigue

## 2022-12-06 NOTE — Progress Notes (Signed)
Gynecologic Oncology Interval Visit   Referring Provider: Dr. Leafy Ro  Chief Complaint: AUB  Subjective:  Rhonda Mccall is a 57 y.o. G0P0 female who is seen in consultation from Dr. Leafy Ro for heavy menstrual periods. 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  [name] presents to discuss management options for symptomatic leiomyoma. She was last seen in our clinic on 05/11/2021 and we discussed the possibility of robotic hysterectomy with Dr. Leafy Ro. Shortly thereafter she was diagnosed with a PE 05/14/2021 and surgery was not pursued.  We recommended Depo-Lupron for control of her abnormal vaginal bleeding.  She did not start Depo Lupron.  Her menstrual cycles were not too bothersome until just recently when she had a large period in February 2024.  She complains of feeling very fatigued, weakness, shortness of breath, cough, abdominal pain and nausea vomiting.  She also has seasonal allergies and she is feeling quite depressed due to the menstrual issues and fatigue.  Due to financial issues she has only been taking the Eliquis intermittently.  She tried to get an appointment with Dr. Leafy Ro however Dr. Leafy Ro does not have any appointment availability until 2025.  She presents today interested in surgical as well as nonsurgical options.  She also requested a diagnostic Pap smear due to her abnormal bleeding issues which we think is reasonable  She had a MRI Pelvic 06/24/2021   Narrative & Impression    -- Uterus: Measures 14.5 x 9.1 by 12.6 cm (volume = 870 cm^3). Diffuse uterine involvement by numerous fibroids is seen. These fibroids are intramural, submucosal, and subserosal in location. The largest  fibroid is located in the left lateral corpus measuring 5.6 cm in maximum diameter.   -- Intracavitary fibroids: A single intracavitary fibroid is seen in the uterine fundus which measures 2.7 cm in maximum diameter.   -- Pedunculated fibroids: Largest pedunculated fibroid is seen in the uterine fundus which measures 4.0 cm in diameter, and has a broad myometrial pedicle of attachment measuring 3.0 cm in thickness. At least 5 other smaller pedunculated fibroids are seen, however each have a broad myometrial pedicle of attachment to the uterus.   -IMPRESSION: Diffuse uterine involvement by numerous fibroids, as described above, largest measuring 5.6 cm in maximum diameter.   2.7 cm intracavitary fibroid in the fundal portion of the endometrial cavity.   Multiple pedunculated fibroids, all of which have broad myometrial pedicles of attachment to the uterus.   Normal appearance of both ovaries. No adnexal mass identified.    Gynecologic Oncology History  Rhonda Mccall is a pleasant female who presented to Dr. Leafy Ro for heavy menstrual bleeding on 05/02/21. She has a history of heavy menses but symptoms had worsened. History of uterine fibroids. Office TVUS with Dr. Leafy Ro showed uterus had grown from 11 cm in 09/2019 to 19 cm.  Endometrial biopsy could not be attained in the clinic due to the location of the cervix.  She underwent D&C 05/02/21.  DIAGNOSIS: A. ENDOCERVIX; CURETTAGE: - BENIGN SQUAMOUS AND ENDOCERVICAL EPITHELIUM. - NEGATIVE FOR DYSPLASIA AND MALIGNANCY.   B. ENDOMETRIUM; CURETTAGE: - SCANT BENIGN ENDOMETRIAL TISSUE, NEGATIVE FOR ATYPIA / EIN AND MALIGNANCY. - BENIGN SQUAMOUS AND ENDOCERVICAL MUCOSA, NEGATIVE FOR DYSPLASIA AND MALIGNANCY.  She was admitted to hospital for acute blood  loss anemia on 05/06/21. She received 1 unit pRBCs and 200 mg venofer. Received IV Lysteda. She was discharged home on 05/06/21.    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   Type 2 diabetes mellitus with hyperglycemia, with long-term current use of insulin ([place]) 06/25/2014   Vitamin D deficiency 06/25/2014    Past Medical History: Past Medical History:  Diagnosis Date   Diabetes mellitus without complication ([place])    Embolism and thrombosis ([place]) 05/2021   GERD (gastroesophageal reflux disease)    Hyperlipidemia    Hypertension    IDA (iron deficiency anemia)     Past Surgical History: Past Surgical History:  Procedure Laterality Date   BREAST BIOPSY Left    benign   BREAST BIOPSY Bilateral    benign   COLONOSCOPY WITH ESOPHAGOGASTRODUODENOSCOPY (EGD)     HYSTEROSCOPY WITH D & C N/A 05/02/2021   Procedure: DILATATION AND CURETTAGE /HYSTEROSCOPY;  Surgeon: Benjaman Kindler, MD;  Location: [facility] ORS;  Service: Gynecology;  Laterality: N/A;    Past Gynecologic History: as per HPI.  Last Pap 2021 per patient normal.  Abnormal Pap Yes 10 years ago - no treatment/procedures needed.   OB History:  OB History  Gravida Para Term Preterm AB Living  4       4    SAB IAB Ectopic Multiple Live Births  1 3          # Outcome Date GA Lbr Len/2nd Weight Sex Delivery Anes PTL Lv  4 IAB           3 IAB           2 IAB           1 SAB             Family History: Family History  Problem Relation Age of Onset   Diabetes Mother    Hypertension Mother    Stroke Mother    Pancreatic cancer Father    Breast cancer Maternal Aunt    Breast cancer Maternal Aunt    Breast cancer Maternal Aunt    Breast cancer Cousin      Social History: Social History   Socioeconomic History   Marital status: Single    Spouse name: Not on file   Number of children: Not on file   Years of education: Not on file   Highest education level: Not on file  Occupational History   Not on file  Tobacco Use   Smoking status: Never   Smokeless tobacco: Never  Vaping Use   Vaping Use: Never used  Substance and Sexual Activity   Alcohol use: No   Drug use: Never   Sexual activity: Not on file  Other Topics Concern   Not on file  Social History Narrative   Not on file   Social Determinants of Health   Financial Resource Strain: Not on file  Food Insecurity: Not on file  Transportation Needs: Not on file  Physical Activity: Not on file  Stress: Not on file  Social Connections: Not on file  Intimate Partner  Violence: Not on file    Allergies: Allergies  Allergen Reactions   Canagliflozin Other (See Comments)    Other reaction(s): Other (See Comments) Yeast infection Yeast infection    Metformin And Related Diarrhea   Sunscreens Hives   Tranexamic Acid Other (See Comments)   Victoza [Liraglutide] Other (See Comments)    Yeast infections    Current Medications: Current Outpatient Medications  Medication Sig Dispense Refill   albuterol (VENTOLIN HFA) 108 (90 Base) MCG/ACT inhaler Inhale into the lungs.     amLODipine (NORVASC) 10 MG tablet Take by mouth.     amLODipine (NORVASC) 5 MG tablet Take 5 mg by mouth daily.     APIXABAN (ELIQUIS) VTE STARTER PACK (10MG  AND 5MG ) Take as directed on package: start with two-5mg  tablets twice daily for 7 days. On day 8, switch to one-5mg  tablet twice daily. 1 each 0   aspirin 81 MG chewable tablet      Blood Glucose Monitoring Suppl (GLUCOCOM BLOOD GLUCOSE MONITOR) DEVI 1 each by XX route as directed     budesonide-formoterol (SYMBICORT) 80-4.5 MCG/ACT inhaler Inhale into the lungs.     canagliflozin (INVOKANA) 100 MG TABS tablet Take by mouth.     cholecalciferol  (VITAMIN D3) 25 MCG (1000 UNIT) tablet Take 5,000 Units by mouth daily.     Cholecalciferol 125 MCG/ML LIQD      cyanocobalamin (VITAMIN B12) 500 MCG tablet Take 1,000 mcg by mouth daily.     dapagliflozin propanediol (FARXIGA) 10 MG TABS tablet Take 10 mg by mouth daily.     Dulaglutide 1.5 MG/0.5ML SOPN Inject 1.5 mg into the skin once a week.     ergocalciferol (VITAMIN D2) 1.25 MG (50000 UT) capsule Take by mouth.     ezetimibe (ZETIA) 10 MG tablet Take 10 mg by mouth daily.     Fluticasone-Umeclidin-Vilant 100-62.5-25 MCG/ACT AEPB Inhale into the lungs.     gentamicin (GARAMYCIN) 0.3 % ophthalmic solution Place into the right eye.     glimepiride (AMARYL) 4 MG tablet Take 4 mg by mouth daily with breakfast.     glucose blood ([employer] BLOOD GLUCOSE TEST) test strip Use 1 each (1 strip total) 4 (four) times daily Use as instructed.     Insulin Degludec 200 UNIT/ML SOPN Inject 40 Units into the skin daily.     liraglutide (VICTOZA) 18 MG/3ML SOPN Inject into the skin.     losartan (COZAAR) 100 MG tablet Take 100 mg by mouth daily.     losartan (COZAAR) 100 MG tablet Take 1 tablet by mouth daily.     metFORMIN (GLUCOPHAGE) 1000 MG tablet Take by mouth.     MOUNJARO 12.5 MG/0.5ML Pen Inject into the skin.     omeprazole (PRILOSEC) 40 MG capsule Take 40 mg by mouth 2 (two) times daily.     rosuvastatin (CRESTOR) 20 MG tablet Take 1 tablet (20 mg total) by mouth daily. 30 tablet 0   hydrOXYzine (ATARAX/VISTARIL) 25 MG tablet Take by mouth. (Patient not taking: Reported on 06/28/2021)     montelukast (SINGULAIR) 10 MG tablet Take by mouth. (Patient not taking: Reported on 06/28/2021)     predniSONE (DELTASONE) 5 MG tablet Take 2 tablets daily on days 7 through 9, then 1 tablet daily on days 10 through 14 (Patient not taking: Reported on 06/28/2021)     No current facility-administered medications for this visit.   Review of Systems General: very fatigued, weak  HEENT: no complaints  Lungs:  shortness  of breath, cough  Cardiac: no complaints  GI:  abdominal pain and nausea vomiting; no constipation or diarrhea  GU: abnormal vaginal bleeding  Musculoskeletal: no complaints  Extremities: no complaints  Skin: no complaints  Neuro: no complaints  Endocrine: no complaints  Psych: no complaints      She also has seasonal allergies and she is feeling quite depressed due to the menstrual issues and fatigue.     Objective:  Physical Examination:  BP (!) 157/76 Comment: 2nd check, advised patient to f/u with PCP  Pulse 94   Temp (!) 97 F (36.1 C) (Tympanic)   Resp 17   Wt (!) 316 lb (143.3 kg)   SpO2 100%   BMI 51.00 kg/m     ECOG Performance Status: 1 - Symptomatic but completely ambulatory  GENERAL: Patient is a well appearing female in no acute distress HEENT:  PERRL, neck supple with midline trachea. Thyroid without masses.  NODES:  No cervical, supraclavicular, axillary, or inguinal lymphadenopathy palpated.  LUNGS:  normal respiratory effort ABDOMEN:  Soft, nontender., nondistended. No mass but exam limited. No ascites.  MSK:  No focal spinal tenderness to palpation.  EXTREMITIES:  No peripheral edema.   SKIN:  Clear with no obvious rashes NEURO:  Nonfocal. Well oriented.  Appropriate affect.  Pelvic: EGBUS: no lesions Cervix: no lesions, nontender, very anterior and difficult to see the entire cervix; long narrow speculum needed. Pap obtained Vagina: narrow vaginal vault Uterus: Unable to determine size Adnexa: no palpable masses; limited by exam Rectovaginal: deferred   Rectovaginal: deferred  Lab Review Labs on site today: none  Lab Results  Component Value Date   WBC 6.6 11/29/2021   HGB 8.8 (L) 11/29/2021   HCT 30.4 (L) 11/29/2021   MCV 73.6 (L) 11/29/2021   PLT 320 11/29/2021   09/28/2022 Hemoglobin A1C 4.2 - 5.6 % 7.6 High      Component Ref Range & Units 2 mo ago  Glucose 70 - 110 mg/dL 155 High   Sodium 136 - 145 mmol/L 141   Potassium 3.6 - 5.1 mmol/L 4.3  Chloride 97 - 109 mmol/L 104  Carbon Dioxide (CO2) 22.0 - 32.0 mmol/L 32.1 High   Urea Nitrogen (BUN) 7 - 25 mg/dL 11  Creatinine 0.6 - 1.1 mg/dL 1.0  Glomerular Filtration Rate (eGFR) >60 mL/min/1.73sq m 66  Comment: CKD-EPI (2021) does not include patient's race in the calculation of eGFR.  Monitoring changes of plasma creatinine and eGFR over time is useful for monitoring kidney function.  Interpretive Ranges for eGFR (CKD-EPI 2021):  eGFR:       >60 mL/min/1.73 sq. m - Normal eGFR:       30-59 mL/min/1.73 sq. m - Moderately Decreased eGFR:       15-29 mL/min/1.73 sq. m  - Severely Decreased eGFR:       < 15 mL/min/1.73 sq. m  - Kidney Failure   Note: These eGFR calculations do not apply in acute situations when eGFR is changing rapidly or patients on dialysis.  Calcium 8.7 - 10.3 mg/dL 9.6  AST 8 - 39 U/L 17  ALT 5 - 38 U/L 14  Alk Phos (alkaline Phosphatase) 34 - 104 U/L 66  Albumin 3.5 - 4.8 g/dL 4.1  Bilirubin, Total 0.3 - 1.2 mg/dL 0.4  Protein, Total 6.1 - 7.9 g/dL 7.3  A/G Ratio 1.0 - 5.0 gm/dL 1.3    Component Ref Range & Units 11/07/2022  Iron 28 - 170 ug/dL 18 Low  Total Iron Binding Capacity (TIBC) 261.0 - 478.0 ug/dL 423.1  Transferrin 203.0 - 362.0 mg/dL 302.2  % Saturation % 4   B12 = 248 low  Radiologic Imaging: 09/24/2019 CLINICAL DATA:  BILATERAL lower abdominal and suprapubic pain since 09/06/2019, abnormal uterine bleeding   EXAM: TRANSABDOMINAL AND TRANSVAGINAL ULTRASOUND OF PELVIS   TECHNIQUE: Both transabdominal and transvaginal ultrasound examinations of the pelvis were performed. Transabdominal technique was performed for global imaging of the pelvis including uterus, ovaries, adnexal regions, and pelvic cul-de-sac. It was necessary to proceed with endovaginal exam following the transabdominal exam to visualize the endometrium and ovaries   COMPARISON:  None   FINDINGS: Uterus    Measurements: 11.6 x 6.2 x 4.7 cm = volume: 177 mL. Multiple uterine nodules are identified consistent with multiple leiomyomata. A largest of these include a posterior RIGHT fundal 7.2 x 6.9 x 5.9 cm subserosal leiomyoma, 6.3 x 3.9 x 5.5 cm fundal leiomyoma, and a 6.2 x 4.5 x 7.5 cm posterior leiomyoma. Several of the leiomyomata appear to extend submucosal.   Endometrium   Thickness: 10 mm.  No endometrial fluid   Right ovary   Measurements: 2.5 x 1.6 x 1.6 cm = volume: 3.4 mL. Normal morphology without mass   Left ovary   Measurements: 3.1 x 2.8 x 2.9 cm = volume: 13.1 mL. Normal morphology without mass   Other findings   No free pelvic fluid. No adnexal masses. Multiple nabothian cysts at cervix.   IMPRESSION: Enlarged uterus containing multiple uterine leiomyomata, some of which extend submucosal.   Unremarkable endometrial complex and ovaries.     Electronically Signed   By: Lavonia Dana M.D.   On: 09/24/2019 12:56   MRI Pelvic 06/24/2021   Narrative & Impression  CLINICAL DATA:  Symptomatic uterine fibroids. Menometrorrhagia. Surgical planning.   EXAM: MRI PELVIS WITHOUT AND WITH CONTRAST   TECHNIQUE: Multiplanar multisequence MR imaging of the pelvis was performed both before and after administration of intravenous contrast.   CONTRAST:  27mL GADAVIST GADOBUTROL 1 MMOL/ML IV SOLN   COMPARISON:  None.   FINDINGS: Lower Urinary Tract: No bladder or urethral abnormality identified.   Bowel:  Unremarkable visualized pelvic bowel loops.   Vascular/Lymphatic: Shotty bilateral inguinal and external iliac lymph nodes are noted, but no pathologically enlarged lymph nodes are identified.   Reproductive:   -- Uterus: Measures 14.5 x 9.1 by 12.6 cm (volume = 870 cm^3). Diffuse uterine involvement by numerous fibroids is seen. These fibroids are intramural, submucosal, and subserosal in location. The largest fibroid is located in the left lateral  corpus measuring 5.6 cm in maximum diameter.   -- Intracavitary fibroids: A single intracavitary fibroid is seen in the uterine fundus which measures 2.7 cm in maximum diameter.   -- Pedunculated fibroids: Largest pedunculated fibroid is seen in the uterine fundus which measures 4.0 cm in diameter, and has a broad myometrial pedicle of attachment measuring 3.0 cm in thickness. At least 5 other smaller pedunculated fibroids are seen, however each have a broad myometrial pedicle of attachment to the uterus.   -- Fibroid contrast enhancement: All fibroids show contrast enhancement, without significant degeneration/devascularization.   -- Right ovary:  Appears normal.  No mass identified.   -- Left ovary:  Appears normal.  No mass identified.   Other: No abnormal free fluid.   Musculoskeletal:  Unremarkable.   IMPRESSION: Diffuse uterine involvement by numerous fibroids, as described above, largest measuring 5.6 cm in maximum diameter.   2.7 cm intracavitary  fibroid in the fundal portion of the endometrial cavity.   Multiple pedunculated fibroids, all of which have broad myometrial pedicles of attachment to the uterus.   Normal appearance of both ovaries. No adnexal mass identified.      Assessment:  Teighan Puza is a 57 y.o. female diagnosed with symptomatic enlarging uterine leiomyoma and mutliple medical problems,   Anemia, most likely from abnormal uterine bleeding, iron deficient.   Elevated HbA1c  Increased shortness of breath and cough, possibly due to anemia versus recurrent PE given intermittent Eliquis usage.    Medical co-morbidities complicating care: HTN, Diabetes, stroke, DVT/PE 05/2021 on Eliquis, Body mass index is 51 kg/m.  Plan:   Problem List Items Addressed This Visit       Cardiovascular and Mediastinum   PE (pulmonary thromboembolism) ([place])   Relevant Orders   CT Angio Chest Pulmonary Embolism (PE) W or WO Contrast     Genitourinary    Dysmenorrhea   Relevant Orders   IGP, Aptima HPV   Uterine fibroid - Primary   Relevant Orders   IGP, Aptima HPV   Other Visit Diagnoses     Shortness of breath       Relevant Orders   CT Angio Chest Pulmonary Embolism (PE) W or WO Contrast      At this point in time I strongly recommended nonsurgical management approaches and workup for her other medical issues.  I am concerned giving her pulmonary symptoms and we recommended a chest CT to evaluate for a PE.  We also requested that she follow-up with Dr. Janese Banks for management of anticoagulation, recommendations on whether Eliquis needs to be continued indefinitely if her chest CT is negative for PE, further evaluation of anemia, and consideration of IV iron transfusion.  She was told by her other physician to start oral iron therapy but she has not yet started.  She has started B12 and vitamin D supplementation.  We will follow up the Pap/HPV. We were unable to obtain an EMBx in clinic, however, last pathology results in 04/2021 were reassuring.   I contacted Dr. Leafy Ro who is able to see her for evaluation of non surgical options to manage her symptomatic fibroids. Given her multiple medical issues and current symptoms she is not an ideal candidate for surgery. If her symptoms are refractory to conservative options then we can discuss other measures including surgery.   The patient's diagnosis, an outline of the further diagnostic and laboratory studies which will be required, the recommendation for surgery, and alternatives were discussed with her and her accompanying family members.  All questions were answered to their satisfaction.  A total of at least 60 minutes were spent with the patient/family today; >50% was spent in education, counseling and coordination of care for symptomatic leiomyoma  Brently Voorhis Gaetana Michaelis, MD     CC:  Dr. Leafy Ro [phone]

## 2022-12-07 ENCOUNTER — Ambulatory Visit
Admission: RE | Admit: 2022-12-07 | Discharge: 2022-12-07 | Disposition: A | Discharge status: Home / Self Care | Payer: Managed Care, Other (non HMO) | Source: Ambulatory Visit | Attending: Obstetrics and Gynecology | Admitting: Obstetrics and Gynecology

## 2022-12-07 ENCOUNTER — Telehealth: Payer: Self-pay

## 2022-12-07 DIAGNOSIS — I2699 Other pulmonary embolism without acute cor pulmonale: Secondary | ICD-10-CM

## 2022-12-07 DIAGNOSIS — R0602 Shortness of breath: Secondary | ICD-10-CM

## 2022-12-07 MED ORDER — IOPAMIDOL (ISOVUE-370) INJECTION 76%
75.0000 mL | Freq: Once | INTRAVENOUS | Status: AC | PRN
  Administered 2022-12-07: 75 mL via INTRAVENOUS

## 2022-12-07 NOTE — Telephone Encounter (Signed)
Called and notified Rhonda Mccall with her CT results.  IMPRESSION: No evidence of pulmonary embolism or other acute intrathoracic process.

## 2022-12-12 ENCOUNTER — Encounter: Payer: Self-pay | Admitting: Obstetrics and Gynecology

## 2022-12-13 ENCOUNTER — Telehealth: Payer: Self-pay

## 2022-12-13 NOTE — Telephone Encounter (Signed)
Received notification from [facility] that pap smear was unable to be processed due to expired vial. Ms. Hoek has been notified. She is seeing her gyn this week and they can recollect if needed. Dr. Theora Gianotti made aware.

## 2022-12-14 ENCOUNTER — Encounter: Payer: Self-pay | Admitting: Obstetrics and Gynecology

## 2022-12-15 ENCOUNTER — Other Ambulatory Visit: Payer: Self-pay | Admitting: Certified Nurse Midwife

## 2022-12-15 DIAGNOSIS — Z1231 Encounter for screening mammogram for malignant neoplasm of breast: Secondary | ICD-10-CM

## 2022-12-19 LAB — GYN REPORT

## 2022-12-25 ENCOUNTER — Ambulatory Visit: Payer: Managed Care, Other (non HMO) | Admitting: Oncology

## 2022-12-29 ENCOUNTER — Inpatient Hospital Stay: Payer: Managed Care, Other (non HMO) | Attending: Obstetrics and Gynecology | Admitting: Oncology

## 2023-01-05 ENCOUNTER — Inpatient Hospital Stay: Payer: Managed Care, Other (non HMO) | Attending: Obstetrics and Gynecology | Admitting: Oncology

## 2023-01-05 ENCOUNTER — Encounter: Payer: Self-pay | Admitting: Oncology

## 2023-01-05 ENCOUNTER — Other Ambulatory Visit: Payer: Self-pay

## 2023-01-05 ENCOUNTER — Inpatient Hospital Stay: Payer: Managed Care, Other (non HMO)

## 2023-01-05 VITALS — BP 137/79 | HR 83 | Temp 97.2°F | Ht 66.0 in | Wt 307.3 lb

## 2023-01-05 DIAGNOSIS — Z7901 Long term (current) use of anticoagulants: Secondary | ICD-10-CM | POA: Insufficient documentation

## 2023-01-05 DIAGNOSIS — Z803 Family history of malignant neoplasm of breast: Secondary | ICD-10-CM | POA: Diagnosis not present

## 2023-01-05 DIAGNOSIS — I10 Essential (primary) hypertension: Secondary | ICD-10-CM | POA: Insufficient documentation

## 2023-01-05 DIAGNOSIS — Z86718 Personal history of other venous thrombosis and embolism: Secondary | ICD-10-CM | POA: Diagnosis not present

## 2023-01-05 DIAGNOSIS — D509 Iron deficiency anemia, unspecified: Secondary | ICD-10-CM | POA: Diagnosis not present

## 2023-01-05 DIAGNOSIS — Z800 Family history of malignant neoplasm of digestive organs: Secondary | ICD-10-CM | POA: Diagnosis not present

## 2023-01-05 DIAGNOSIS — E119 Type 2 diabetes mellitus without complications: Secondary | ICD-10-CM | POA: Diagnosis not present

## 2023-01-05 DIAGNOSIS — N920 Excessive and frequent menstruation with regular cycle: Secondary | ICD-10-CM | POA: Diagnosis not present

## 2023-01-05 LAB — CBC
HCT: 29.2 % — ABNORMAL LOW (ref 36.0–46.0)
Hemoglobin: 8.1 g/dL — ABNORMAL LOW (ref 12.0–15.0)
MCH: 18.4 pg — ABNORMAL LOW (ref 26.0–34.0)
MCHC: 27.7 g/dL — ABNORMAL LOW (ref 30.0–36.0)
MCV: 66.2 fL — ABNORMAL LOW (ref 80.0–100.0)
Platelets: 331 10*3/uL (ref 150–400)
RBC: 4.41 MIL/uL (ref 3.87–5.11)
RDW: 23.1 % — ABNORMAL HIGH (ref 11.5–15.5)
WBC: 5.2 10*3/uL (ref 4.0–10.5)
nRBC: 0 % (ref 0.0–0.2)

## 2023-01-05 LAB — IRON AND TIBC
Iron: 181 ug/dL — ABNORMAL HIGH (ref 28–170)
Saturation Ratios: 44 % — ABNORMAL HIGH (ref 10.4–31.8)
TIBC: 416 ug/dL (ref 250–450)
UIBC: 235 ug/dL

## 2023-01-05 LAB — VITAMIN B12: Vitamin B-12: 321 pg/mL (ref 180–914)

## 2023-01-05 LAB — FOLATE: Folate: 10.8 ng/mL (ref >5.9)

## 2023-01-05 LAB — FERRITIN: Ferritin: 7 ng/mL — ABNORMAL LOW (ref 11–307)

## 2023-01-05 NOTE — Progress Notes (Signed)
Hematology/Oncology Consult note [facility]  Telephone:([phone] Fax:([phone]) [phone]  Patient Care Team: Alm Bustard, NP as PCP - General (Family Medicine) Artelia Laroche, MD as Referring Physician (Obstetrics)   Name of the patient: Rhonda Mccall  [id number]  [birth year]   Date of visit: 01/05/23  Diagnosis- 1.  History of left lower extremity DVT in September 2022 2.  History of iron deficiency anemia  Chief complaint/ Reason for visit-routine follow-up of anemia and history of DVT  Heme/Onc history: patient is a 57 year old African-American female with a past medical history significant for hypertension hyperlipidemia type 2 diabetes among other medical problems.  She recently underwent left lower extremity ultrasound on 05/13/2021 which showed occlusive thrombus limited to the peroneal vein of the calf.  Remainder of the deep veins were patent.  CT angio chest showed acute right lower lobe subsegmental pulmonary embolus with no evidence of right heart strain.  This was done after patient was started on Lysteda for her heavy menstrual cycles and patient complained of left leg pain following the start of the drug.   Incidentally a 2.9 x 2 cm right breast mass was also reported and a mammogram was recommended which the patient has not had yet.  MR pelvis with and without contrast on 06/23/2021 showed multiple uterine fibroids.  Patient has seen GYN oncology for this and hopes to go for surgery soon which can be potentially delayed due to new diagnosis of pulmonary embolism.  In the meanwhile to control her menstrual cycles she was recommended Lupron injections.  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  Similarly she had biopsies of the right breast as well which showed fibroadenoma with microcalcifications.  States that this is being followed at [facility]  Interval  history-currently patient reports feeling fatigued.  She has been unable to work out and lose weight because of that.  States that she could not afford Eliquis and stopped taking it about a month ago.  She has not had a colonoscopy yet.  Denies any blood loss in her stool or urine.  She does have irregular menstrual cycles and follows up with GYN and GYN oncology.  ECOG PS- 1 Pain scale- 0 Opioid associated constipation- no  Review of systems- Review of Systems  Constitutional:  Positive for malaise/fatigue. Negative for chills, fever and weight loss.  HENT:  Negative for congestion, ear discharge and nosebleeds.   Eyes:  Negative for blurred vision.  Respiratory:  Negative for cough, hemoptysis, sputum production, shortness of breath and wheezing.   Cardiovascular:  Negative for chest pain, palpitations, orthopnea and claudication.  Gastrointestinal:  Negative for abdominal pain, blood in stool, constipation, diarrhea, heartburn, melena, nausea and vomiting.  Genitourinary:  Negative for dysuria, flank pain, frequency, hematuria and urgency.  Musculoskeletal:  Negative for back pain, joint pain and myalgias.  Skin:  Negative for rash.  Neurological:  Negative for dizziness, tingling, focal weakness, seizures, weakness and headaches.  Endo/Heme/Allergies:  Does not bruise/bleed easily.  Psychiatric/Behavioral:  Negative for depression and suicidal ideas. The patient does not have insomnia.       Allergies  Allergen Reactions   Canagliflozin Other (See Comments)    Other reaction(s): Other (See Comments) Yeast infection Yeast infection    Metformin And Related Diarrhea   Sunscreens Hives   Tranexamic Acid Other (See Comments)   Victoza [Liraglutide] Other (See Comments)    Yeast infections  Past Medical History:  Diagnosis Date   Diabetes mellitus without complication (HCC)    Embolism and thrombosis (HCC) 05/2021   GERD (gastroesophageal reflux disease)    Hyperlipidemia     Hypertension    IDA (iron deficiency anemia)      Past Surgical History:  Procedure Laterality Date   BREAST BIOPSY Left    benign   BREAST BIOPSY Bilateral    benign   COLONOSCOPY WITH ESOPHAGOGASTRODUODENOSCOPY (EGD)     HYSTEROSCOPY WITH D & C N/A 05/02/2021   Procedure: DILATATION AND CURETTAGE /HYSTEROSCOPY;  Surgeon: Christeen Douglas, MD;  Location: [facility] ORS;  Service: Gynecology;  Laterality: N/A;    Social History   Socioeconomic History   Marital status: Single    Spouse name: Not on file   Number of children: Not on file   Years of education: Not on file   Highest education level: Not on file  Occupational History   Not on file  Tobacco Use   Smoking status: Never   Smokeless tobacco: Never  Vaping Use   Vaping Use: Never used  Substance and Sexual Activity   Alcohol use: No   Drug use: Never   Sexual activity: Not on file  Other Topics Concern   Not on file  Social History Narrative   Not on file   Social Determinants of Health   Financial Resource Strain: Not on file  Food Insecurity: Not on file  Transportation Needs: Not on file  Physical Activity: Not on file  Stress: Not on file  Social Connections: Not on file  Intimate Partner Violence: Not on file    Family History  Problem Relation Age of Onset   Diabetes Mother    Hypertension Mother    Stroke Mother    Pancreatic cancer Father    Breast cancer Maternal Aunt    Breast cancer Maternal Aunt    Breast cancer Maternal Aunt    Breast cancer Cousin      Current Outpatient Medications:    aspirin 81 MG chewable tablet, , Disp: , Rfl:    Blood Glucose Monitoring Suppl (GLUCOCOM BLOOD GLUCOSE MONITOR) DEVI, 1 each by XX route as directed, Disp: , Rfl:    budesonide-formoterol (SYMBICORT) 160-4.5 MCG/ACT inhaler, Inhale into the lungs., Disp: , Rfl:    cholecalciferol (VITAMIN D3) 25 MCG (1000 UNIT) tablet, Take 5,000 Units by mouth daily., Disp: , Rfl:    cyanocobalamin (VITAMIN B12)  500 MCG tablet, Take 1,000 mcg by mouth daily., Disp: , Rfl:    dapagliflozin propanediol (FARXIGA) 10 MG TABS tablet, Take 10 mg by mouth daily., Disp: , Rfl:    Dulaglutide 1.5 MG/0.5ML SOPN, Inject 1.5 mg into the skin once a week., Disp: , Rfl:    ergocalciferol (VITAMIN D2) 1.25 MG (50000 UT) capsule, Take by mouth., Disp: , Rfl:    ezetimibe (ZETIA) 10 MG tablet, Take 10 mg by mouth daily., Disp: , Rfl:    ferrous sulfate 300 (60 Fe) MG/5ML syrup, Take by mouth., Disp: , Rfl:    glimepiride (AMARYL) 4 MG tablet, Take 4 mg by mouth daily with breakfast., Disp: , Rfl:    losartan (COZAAR) 100 MG tablet, Take 100 mg by mouth daily., Disp: , Rfl:    metFORMIN (GLUCOPHAGE) 1000 MG tablet, Take by mouth., Disp: , Rfl:    MOUNJARO 12.5 MG/0.5ML Pen, Inject into the skin., Disp: , Rfl:    omeprazole (PRILOSEC) 40 MG capsule, Take 40 mg by mouth 2 (two) times  daily., Disp: , Rfl:    rosuvastatin (CRESTOR) 20 MG tablet, Take 1 tablet (20 mg total) by mouth daily., Disp: 30 tablet, Rfl: 0   albuterol (VENTOLIN HFA) 108 (90 Base) MCG/ACT inhaler, Inhale into the lungs., Disp: , Rfl:    amLODipine (NORVASC) 5 MG tablet, Take 5 mg by mouth daily., Disp: , Rfl:    apixaban (ELIQUIS) 5 MG TABS tablet, Take by mouth. (Patient not taking: Reported on 01/05/2023), Disp: , Rfl:    APIXABAN (ELIQUIS) VTE STARTER PACK (10MG  AND 5MG ), Take as directed on package: start with two-5mg  tablets twice daily for 7 days. On day 8, switch to one-5mg  tablet twice daily. (Patient not taking: Reported on 01/05/2023), Disp: 1 each, Rfl: 0   budesonide-formoterol (SYMBICORT) 80-4.5 MCG/ACT inhaler, Inhale into the lungs. (Patient not taking: Reported on 01/05/2023), Disp: , Rfl:    Fluticasone-Umeclidin-Vilant 100-62.5-25 MCG/ACT AEPB, Inhale into the lungs. (Patient not taking: Reported on 01/05/2023), Disp: , Rfl:    gentamicin (GARAMYCIN) 0.3 % ophthalmic solution, Place into the right eye. (Patient not taking: Reported on  01/05/2023), Disp: , Rfl:    glucose blood ([employer] BLOOD GLUCOSE TEST) test strip, Use 1 each (1 strip total) 4 (four) times daily Use as instructed. (Patient not taking: Reported on 01/05/2023), Disp: , Rfl:    Insulin Degludec 200 UNIT/ML SOPN, Inject 40 Units into the skin daily., Disp: , Rfl:    losartan (COZAAR) 100 MG tablet, Take 1 tablet by mouth daily. (Patient not taking: Reported on 01/05/2023), Disp: , Rfl:    predniSONE (DELTASONE) 5 MG tablet, Take 2 tablets daily on days 7 through 9, then 1 tablet daily on days 10 through 14 (Patient not taking: Reported on 06/28/2021), Disp: , Rfl:   Physical exam:  Vitals:   01/05/23 1120  BP: 137/79  Pulse: 83  Temp: (!) 97.2 F (36.2 C)  TempSrc: Tympanic  SpO2: 100%  Weight: (!) 307 lb 4.8 oz (139.4 kg)  Height: 5\' 6"  (1.676 m)   Physical Exam Cardiovascular:     Rate and Rhythm: Normal rate and regular rhythm.     Heart sounds: Normal heart sounds.  Pulmonary:     Effort: Pulmonary effort is normal.     Breath sounds: Normal breath sounds.  Skin:    General: Skin is warm and dry.  Neurological:     Mental Status: She is alert and oriented to person, place, and time.         Latest Ref Rng & Units 05/15/2021    4:20 AM  CMP  Glucose 70 - 99 mg/dL 86   BUN 6 - 20 mg/dL 8   Creatinine 1.61 - 0.96 mg/dL 0.45   Sodium 409 - 811 mmol/L 139   Potassium 3.5 - 5.1 mmol/L 3.6   Chloride 98 - 111 mmol/L 109   CO2 22 - 32 mmol/L 25   Calcium 8.9 - 10.3 mg/dL 8.5       Latest Ref Rng & Units 01/05/2023   12:12 PM  CBC  WBC 4.0 - 10.5 K/uL 5.2   Hemoglobin 12.0 - 15.0 g/dL 8.1   Hematocrit 91.4 - 46.0 % 29.2   Platelets 150 - 400 K/uL 331     No images are attached to the encounter.  CT Angio Chest Pulmonary Embolism (PE) W or WO Contrast  Result Date: 12/07/2022 CLINICAL DATA:  Shortness of breath, history of PE. Recently stopped Eliquis. EXAM: CT ANGIOGRAPHY CHEST WITH CONTRAST TECHNIQUE: Multidetector CT imaging of  the  chest was performed using the standard protocol during bolus administration of intravenous contrast. Multiplanar CT image reconstructions and MIPs were obtained to evaluate the vascular anatomy. RADIATION DOSE REDUCTION: This exam was performed according to the departmental dose-optimization program which includes automated exposure control, adjustment of the mA and/or kV according to patient size and/or use of iterative reconstruction technique. CONTRAST:  75mL ISOVUE-370 IOPAMIDOL (ISOVUE-370) INJECTION 76% COMPARISON:  CTA chest 05/13/2021. FINDINGS: Cardiovascular: Satisfactory opacification of the pulmonary arteries to the segmental level. No evidence of pulmonary embolism. Normal heart size. No pericardial effusion. Mediastinum/Nodes: No enlarged mediastinal, hilar, or axillary lymph nodes. Thyroid gland, trachea, and esophagus demonstrate no significant findings. Lungs/Pleura: Lungs are clear. No pleural effusion or pneumothorax. Upper Abdomen: No acute abnormality. Musculoskeletal: No chest wall abnormality. No acute or significant osseous findings. Review of the MIP images confirms the above findings. IMPRESSION: No evidence of pulmonary embolism or other acute intrathoracic process. Electronically Signed   By: Orvan Falconer M.D.   On: 12/07/2022 10:24     Assessment and plan- Patient is a 57 y.o. female here for follow-up of following issues:  Iron deficiency anemia: Likely secondary to menorrhagia.  However she is never had a colonoscopy I have encouraged her to speak to [facility] about it.  She works with [facility] and I strongly recommend that she should get a colonoscopy done ASAP.  She is significantly anemic today with a hemoglobin of 8.1 and iron studies indicative of iron deficiency.  We will therefore schedule her for IV iron either Venofer x 5 or Feraheme x 2 based on what her insurance allows.  Discussed risks and benefits of IV iron including all but not limited to possible risk of anaphylactic  and infusion reaction.  Patient understands and agrees to proceed as planned.  Repeat CBC ferritin and iron studies in 2 months and I will see her thereafter.  2.  History of left lower extremity DVT and subsegmental PE in September 2022.  Patient had a hypercoagulable workup done in the past which was negative.  She was previously on Eliquis and stopped taking it about a month ago mainly because she could not afford it.  I think it would be reasonable to see how she does off anticoagulation especially in the light of iron deficiency anemia as well.  Her recent CT angiogram did not show any evidence of PE.  I do suspect that her shortness of breath that she was experiencing at that time was secondary to her anemia.  If she were to have another thrombotic episode while of anticoagulation she may need lifelong anticoagulation.   Visit Diagnosis 1. Iron deficiency anemia, unspecified iron deficiency anemia type   2. History of DVT (deep vein thrombosis)      Dr. Owens Shark, MD, MPH [facility] [phone] 01/05/2023 2:08 PM

## 2023-01-10 ENCOUNTER — Other Ambulatory Visit: Payer: Self-pay | Admitting: Oncology

## 2023-01-10 ENCOUNTER — Inpatient Hospital Stay: Payer: Managed Care, Other (non HMO) | Attending: Obstetrics and Gynecology

## 2023-01-10 VITALS — BP 128/60 | HR 87 | Temp 97.9°F | Resp 16

## 2023-01-10 DIAGNOSIS — D259 Leiomyoma of uterus, unspecified: Secondary | ICD-10-CM

## 2023-01-10 DIAGNOSIS — D509 Iron deficiency anemia, unspecified: Secondary | ICD-10-CM | POA: Diagnosis present

## 2023-01-10 MED ORDER — SODIUM CHLORIDE 0.9 % IV SOLN
200.0000 mg | INTRAVENOUS | Status: DC
  Administered 2023-01-10: 200 mg via INTRAVENOUS
  Filled 2023-01-10: qty 10

## 2023-01-10 MED ORDER — SODIUM CHLORIDE 0.9 % IV SOLN
Freq: Once | INTRAVENOUS | Status: AC
  Filled 2023-01-10: qty 250

## 2023-01-11 MED FILL — Iron Sucrose Inj 20 MG/ML (Fe Equiv): INTRAVENOUS | Qty: 10 | Status: AC

## 2023-01-12 ENCOUNTER — Inpatient Hospital Stay: Payer: Managed Care, Other (non HMO)

## 2023-01-12 VITALS — BP 141/75 | HR 87 | Temp 98.5°F

## 2023-01-12 DIAGNOSIS — D509 Iron deficiency anemia, unspecified: Secondary | ICD-10-CM | POA: Diagnosis not present

## 2023-01-12 DIAGNOSIS — D259 Leiomyoma of uterus, unspecified: Secondary | ICD-10-CM

## 2023-01-12 MED ORDER — SODIUM CHLORIDE 0.9 % IV SOLN
200.0000 mg | INTRAVENOUS | Status: DC
  Administered 2023-01-12: 200 mg via INTRAVENOUS
  Filled 2023-01-12: qty 200

## 2023-01-12 MED ORDER — SODIUM CHLORIDE 0.9 % IV SOLN
Freq: Once | INTRAVENOUS | Status: AC
  Filled 2023-01-12: qty 250

## 2023-01-16 ENCOUNTER — Inpatient Hospital Stay: Payer: Managed Care, Other (non HMO)

## 2023-01-16 VITALS — BP 141/75 | HR 98 | Temp 98.5°F

## 2023-01-16 DIAGNOSIS — D259 Leiomyoma of uterus, unspecified: Secondary | ICD-10-CM

## 2023-01-16 MED ORDER — SODIUM CHLORIDE 0.9 % IV SOLN
200.0000 mg | INTRAVENOUS | Status: DC
  Filled 2023-01-16: qty 10

## 2023-01-16 MED ORDER — SODIUM CHLORIDE 0.9 % IV SOLN
Freq: Once | INTRAVENOUS | Status: DC
  Filled 2023-01-16: qty 250

## 2023-01-16 NOTE — Progress Notes (Signed)
Unable to obtain IV access today. Patient will be rescheduled for additional iron infusion at a later date.

## 2023-01-17 ENCOUNTER — Telehealth: Payer: Self-pay | Admitting: *Deleted

## 2023-01-17 MED FILL — Iron Sucrose Inj 20 MG/ML (Fe Equiv): INTRAVENOUS | Qty: 10 | Status: AC

## 2023-01-17 NOTE — Telephone Encounter (Signed)
Pt called and stated there was difficulty getting an IV started yesterday in infusion for IV iron. Pt stated she works at [employer] and wanted to know if one of the GI nurses accessed IV site and sent pt over with saline lock if we could administer iron.    Message sent to Maxcine Ham, RN charge nurse in infusion and she stated unfortunately since [facility] is a separate clinic from [facility] that they would be unable to administer through that IV site.    RN called patient and explained above and pt verbalized understanding.

## 2023-01-18 ENCOUNTER — Inpatient Hospital Stay: Payer: Managed Care, Other (non HMO)

## 2023-01-18 VITALS — BP 146/77 | HR 89

## 2023-01-18 DIAGNOSIS — D259 Leiomyoma of uterus, unspecified: Secondary | ICD-10-CM

## 2023-01-18 DIAGNOSIS — D509 Iron deficiency anemia, unspecified: Secondary | ICD-10-CM | POA: Diagnosis not present

## 2023-01-18 MED ORDER — SODIUM CHLORIDE 0.9 % IV SOLN
200.0000 mg | INTRAVENOUS | Status: DC
  Administered 2023-01-18: 200 mg via INTRAVENOUS
  Filled 2023-01-18: qty 10

## 2023-01-18 MED ORDER — SODIUM CHLORIDE 0.9 % IV SOLN
Freq: Once | INTRAVENOUS | Status: AC
  Filled 2023-01-18: qty 250

## 2023-01-18 NOTE — Patient Instructions (Signed)

## 2023-01-22 MED FILL — Iron Sucrose Inj 20 MG/ML (Fe Equiv): INTRAVENOUS | Qty: 10 | Status: AC

## 2023-01-23 ENCOUNTER — Inpatient Hospital Stay: Payer: Managed Care, Other (non HMO)

## 2023-01-23 VITALS — BP 142/81 | HR 82 | Temp 96.2°F | Resp 16

## 2023-01-23 DIAGNOSIS — D259 Leiomyoma of uterus, unspecified: Secondary | ICD-10-CM

## 2023-01-23 DIAGNOSIS — D509 Iron deficiency anemia, unspecified: Secondary | ICD-10-CM | POA: Diagnosis not present

## 2023-01-23 MED ORDER — SODIUM CHLORIDE 0.9 % IV SOLN
Freq: Once | INTRAVENOUS | Status: AC
  Filled 2023-01-23: qty 250

## 2023-01-23 MED ORDER — SODIUM CHLORIDE 0.9 % IV SOLN
200.0000 mg | INTRAVENOUS | Status: DC
  Administered 2023-01-23: 200 mg via INTRAVENOUS
  Filled 2023-01-23: qty 10

## 2023-01-23 NOTE — Progress Notes (Signed)
Pt has been educated and understands. Pt declined to stay 30 mins after iron infusion. VSS.  

## 2023-01-25 ENCOUNTER — Inpatient Hospital Stay: Payer: Managed Care, Other (non HMO)

## 2023-01-25 VITALS — BP 137/74 | HR 85 | Temp 98.9°F | Resp 18

## 2023-01-25 DIAGNOSIS — D509 Iron deficiency anemia, unspecified: Secondary | ICD-10-CM

## 2023-01-25 MED ORDER — SODIUM CHLORIDE 0.9 % IV SOLN
200.0000 mg | Freq: Once | INTRAVENOUS | Status: AC
  Administered 2023-01-25: 200 mg via INTRAVENOUS
  Filled 2023-01-25: qty 200

## 2023-01-25 NOTE — Patient Instructions (Signed)

## 2023-01-25 NOTE — Progress Notes (Signed)
Declined 30 minute post-observation. Vitals stable at discharge.  

## 2023-03-13 ENCOUNTER — Inpatient Hospital Stay: Payer: Managed Care, Other (non HMO)

## 2023-03-14 ENCOUNTER — Inpatient Hospital Stay: Payer: Managed Care, Other (non HMO) | Attending: Obstetrics and Gynecology

## 2023-03-14 DIAGNOSIS — D509 Iron deficiency anemia, unspecified: Secondary | ICD-10-CM | POA: Diagnosis present

## 2023-03-14 DIAGNOSIS — Z86718 Personal history of other venous thrombosis and embolism: Secondary | ICD-10-CM | POA: Insufficient documentation

## 2023-03-14 DIAGNOSIS — D259 Leiomyoma of uterus, unspecified: Secondary | ICD-10-CM | POA: Insufficient documentation

## 2023-03-14 DIAGNOSIS — Z803 Family history of malignant neoplasm of breast: Secondary | ICD-10-CM | POA: Insufficient documentation

## 2023-03-14 DIAGNOSIS — Z800 Family history of malignant neoplasm of digestive organs: Secondary | ICD-10-CM | POA: Diagnosis not present

## 2023-03-14 LAB — CBC
HCT: 41.3 % (ref 36.0–46.0)
Hemoglobin: 12.6 g/dL (ref 12.0–15.0)
MCH: 23.6 pg — ABNORMAL LOW (ref 26.0–34.0)
MCHC: 30.5 g/dL (ref 30.0–36.0)
MCV: 77.5 fL — ABNORMAL LOW (ref 80.0–100.0)
Platelets: 228 10*3/uL (ref 150–400)
RBC: 5.33 MIL/uL — ABNORMAL HIGH (ref 3.87–5.11)
RDW: 23.4 % — ABNORMAL HIGH (ref 11.5–15.5)
WBC: 4.5 10*3/uL (ref 4.0–10.5)
nRBC: 0 % (ref 0.0–0.2)

## 2023-03-14 LAB — IRON AND TIBC
Iron: 72 ug/dL (ref 28–170)
Saturation Ratios: 23 % (ref 10.4–31.8)
TIBC: 315 ug/dL (ref 250–450)
UIBC: 243 ug/dL

## 2023-03-14 LAB — FERRITIN: Ferritin: 64 ng/mL (ref 11–307)

## 2023-03-16 ENCOUNTER — Inpatient Hospital Stay: Payer: Managed Care, Other (non HMO) | Admitting: Medical Oncology

## 2023-03-16 ENCOUNTER — Encounter: Payer: Self-pay | Admitting: Medical Oncology

## 2023-03-16 VITALS — BP 145/64 | HR 84 | Temp 98.1°F | Resp 17 | Wt 304.0 lb

## 2023-03-16 DIAGNOSIS — Z86718 Personal history of other venous thrombosis and embolism: Secondary | ICD-10-CM | POA: Diagnosis not present

## 2023-03-16 DIAGNOSIS — I2699 Other pulmonary embolism without acute cor pulmonale: Secondary | ICD-10-CM | POA: Diagnosis not present

## 2023-03-16 DIAGNOSIS — D509 Iron deficiency anemia, unspecified: Secondary | ICD-10-CM | POA: Diagnosis not present

## 2023-03-16 DIAGNOSIS — D259 Leiomyoma of uterus, unspecified: Secondary | ICD-10-CM

## 2023-03-16 NOTE — Progress Notes (Signed)
Hematology/Oncology Consult note [facility]  [phone] [phone]  Patient Care Team: Alm Bustard, NP as PCP - General (Family Medicine) Artelia Laroche, MD as Referring Physician (Obstetrics)   Name of the patient: Rhonda Mccall  [id number]  1966-01-17   Date of visit: 03/16/23  Diagnosis- 1.  History of left lower extremity DVT in September 2022 2.  History of iron deficiency anemia  Chief complaint/ Reason for visit-routine follow-up of anemia and history of DVT  Heme/Onc history: patient is a 57 year old African-American female with a past medical history significant for hypertension hyperlipidemia type 2 diabetes among other medical problems.  She recently underwent left lower extremity ultrasound on 05/13/2021 which showed occlusive thrombus limited to the peroneal vein of the calf.  Remainder of the deep veins were patent.  CT angio chest showed acute right lower lobe subsegmental pulmonary embolus with no evidence of right heart strain.  This was done after patient was started on Lysteda for her heavy menstrual cycles and patient complained of left leg pain following the start of the drug.   Incidentally a 2.9 x 2 cm right breast mass was also reported and a mammogram was recommended which the patient has not had yet.  MR pelvis with and without contrast on 06/23/2021 showed multiple uterine fibroids.  Patient has seen GYN oncology for this and hopes to go for surgery soon which can be potentially delayed due to new diagnosis of pulmonary embolism.  In the meanwhile to control her menstrual cycles she was recommended Lupron injections.  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  Similarly she had biopsies of the right breast as well which showed fibroadenoma with microcalcifications.  States that this is being followed at [facility]  Interval history- She  states that she is doing well. Fatigue has resolved. She no longer has SOB. She continues to take a once daily oral iron supplement. She denies epistaxis, hematuria, melena, hemoptysis. Menstrual cycles are light in nature now. She has not seen GYN or GI despite advisement in the past.   ECOG PS- 1 Pain scale- 0 Opioid associated constipation- no  Review of systems- Review of Systems  Constitutional:  Negative for chills, fever, malaise/fatigue and weight loss.  HENT:  Negative for congestion, ear discharge and nosebleeds.   Eyes:  Negative for blurred vision.  Respiratory:  Negative for cough, hemoptysis, sputum production, shortness of breath and wheezing.   Cardiovascular:  Negative for chest pain, palpitations, orthopnea and claudication.  Gastrointestinal:  Negative for abdominal pain, blood in stool, constipation, diarrhea, heartburn, melena, nausea and vomiting.  Genitourinary:  Negative for dysuria, flank pain, frequency, hematuria and urgency.  Musculoskeletal:  Negative for back pain, joint pain and myalgias.  Skin:  Negative for rash.  Neurological:  Negative for dizziness, tingling, focal weakness, seizures, weakness and headaches.  Endo/Heme/Allergies:  Does not bruise/bleed easily.  Psychiatric/Behavioral:  Negative for depression and suicidal ideas. The patient does not have insomnia.       Allergies  Allergen Reactions   Canagliflozin Other (See Comments)    Other reaction(s): Other (See Comments) Yeast infection Yeast infection    Metformin And Related Diarrhea   Sunscreens Hives   Tranexamic Acid Other (See Comments)   Victoza [Liraglutide] Other (See Comments)    Yeast infections     Past Medical History:  Diagnosis Date   Diabetes mellitus without complication (HCC)  Embolism and thrombosis (HCC) 05/2021   GERD (gastroesophageal reflux disease)    Hyperlipidemia    Hypertension    IDA (iron deficiency anemia)      Past Surgical History:   Procedure Laterality Date   BREAST BIOPSY Left    benign   BREAST BIOPSY Bilateral    benign   COLONOSCOPY WITH ESOPHAGOGASTRODUODENOSCOPY (EGD)     HYSTEROSCOPY WITH D & C N/A 05/02/2021   Procedure: DILATATION AND CURETTAGE /HYSTEROSCOPY;  Surgeon: Christeen Douglas, MD;  Location: [facility] ORS;  Service: Gynecology;  Laterality: N/A;    Social History   Socioeconomic History   Marital status: Single    Spouse name: Not on file   Number of children: Not on file   Years of education: Not on file   Highest education level: Not on file  Occupational History   Not on file  Tobacco Use   Smoking status: Never   Smokeless tobacco: Never  Vaping Use   Vaping Use: Never used  Substance and Sexual Activity   Alcohol use: No   Drug use: Never   Sexual activity: Not on file  Other Topics Concern   Not on file  Social History Narrative   Not on file   Social Determinants of Health   Financial Resource Strain: Not on file  Food Insecurity: Not on file  Transportation Needs: Not on file  Physical Activity: Not on file  Stress: Not on file  Social Connections: Not on file  Intimate Partner Violence: Not on file    Family History  Problem Relation Age of Onset   Diabetes Mother    Hypertension Mother    Stroke Mother    Pancreatic cancer Father    Breast cancer Maternal Aunt    Breast cancer Maternal Aunt    Breast cancer Maternal Aunt    Breast cancer Cousin      Current Outpatient Medications:    amLODipine (NORVASC) 5 MG tablet, Take 5 mg by mouth daily., Disp: , Rfl:    aspirin 81 MG chewable tablet, , Disp: , Rfl:    Blood Glucose Monitoring Suppl (GLUCOCOM BLOOD GLUCOSE MONITOR) DEVI, 1 each by XX route as directed, Disp: , Rfl:    budesonide-formoterol (SYMBICORT) 160-4.5 MCG/ACT inhaler, Inhale into the lungs., Disp: , Rfl:    cholecalciferol (VITAMIN D3) 25 MCG (1000 UNIT) tablet, Take 5,000 Units by mouth daily., Disp: , Rfl:    cyanocobalamin (VITAMIN B12)  500 MCG tablet, Take 1,000 mcg by mouth daily., Disp: , Rfl:    dapagliflozin propanediol (FARXIGA) 10 MG TABS tablet, Take 10 mg by mouth daily., Disp: , Rfl:    Dulaglutide 1.5 MG/0.5ML SOPN, Inject 1.5 mg into the skin once a week., Disp: , Rfl:    ergocalciferol (VITAMIN D2) 1.25 MG (50000 UT) capsule, Take by mouth., Disp: , Rfl:    ezetimibe (ZETIA) 10 MG tablet, Take 10 mg by mouth daily., Disp: , Rfl:    ferrous sulfate 300 (60 Fe) MG/5ML syrup, Take by mouth., Disp: , Rfl:    glimepiride (AMARYL) 4 MG tablet, Take 4 mg by mouth daily with breakfast., Disp: , Rfl:    Insulin Degludec 200 UNIT/ML SOPN, Inject 40 Units into the skin daily., Disp: , Rfl:    losartan (COZAAR) 100 MG tablet, Take 100 mg by mouth daily., Disp: , Rfl:    losartan (COZAAR) 100 MG tablet, Take 1 tablet by mouth daily., Disp: , Rfl:    metFORMIN (GLUCOPHAGE) 1000 MG tablet,  Take by mouth., Disp: , Rfl:    MOUNJARO 12.5 MG/0.5ML Pen, Inject into the skin., Disp: , Rfl:    omeprazole (PRILOSEC) 40 MG capsule, Take 40 mg by mouth 2 (two) times daily., Disp: , Rfl:    rosuvastatin (CRESTOR) 20 MG tablet, Take 1 tablet (20 mg total) by mouth daily., Disp: 30 tablet, Rfl: 0   apixaban (ELIQUIS) 5 MG TABS tablet, Take by mouth. (Patient not taking: Reported on 01/05/2023), Disp: , Rfl:    APIXABAN (ELIQUIS) VTE STARTER PACK (10MG  AND 5MG ), Take as directed on package: start with two-5mg  tablets twice daily for 7 days. On day 8, switch to one-5mg  tablet twice daily. (Patient not taking: Reported on 01/05/2023), Disp: 1 each, Rfl: 0   budesonide-formoterol (SYMBICORT) 80-4.5 MCG/ACT inhaler, Inhale into the lungs. (Patient not taking: Reported on 01/05/2023), Disp: , Rfl:    Fluticasone-Umeclidin-Vilant 100-62.5-25 MCG/ACT AEPB, Inhale into the lungs. (Patient not taking: Reported on 01/05/2023), Disp: , Rfl:    gentamicin (GARAMYCIN) 0.3 % ophthalmic solution, Place into the right eye. (Patient not taking: Reported on  01/05/2023), Disp: , Rfl:    glucose blood ([employer] BLOOD GLUCOSE TEST) test strip, Use 1 each (1 strip total) 4 (four) times daily Use as instructed. (Patient not taking: Reported on 01/05/2023), Disp: , Rfl:    predniSONE (DELTASONE) 5 MG tablet, Take 2 tablets daily on days 7 through 9, then 1 tablet daily on days 10 through 14 (Patient not taking: Reported on 06/28/2021), Disp: , Rfl:   Physical exam:  Vitals:   03/16/23 0942  BP: (!) 145/64  Pulse: 84  Resp: 17  Temp: 98.1 F (36.7 C)  TempSrc: Tympanic  SpO2: 100%  Weight: (!) 304 lb (137.9 kg)   Physical Exam Vitals and nursing note reviewed.  Constitutional:      General: She is not in acute distress.    Appearance: Normal appearance. She is obese. She is not ill-appearing, toxic-appearing or diaphoretic.  HENT:     Head: Normocephalic and atraumatic.     Nose: Nose normal.     Mouth/Throat:     Mouth: Mucous membranes are moist.  Eyes:     Conjunctiva/sclera: Conjunctivae normal.  Cardiovascular:     Rate and Rhythm: Normal rate and regular rhythm.     Heart sounds: Normal heart sounds.  Pulmonary:     Effort: Pulmonary effort is normal.     Breath sounds: Normal breath sounds.  Abdominal:     Palpations: Abdomen is soft.  Musculoskeletal:     Cervical back: Neck supple.  Lymphadenopathy:     Cervical: No cervical adenopathy.  Skin:    General: Skin is warm and dry.  Neurological:     Mental Status: She is alert and oriented to person, place, and time.         Latest Ref Rng & Units 05/15/2021    4:20 AM  CMP  Glucose 70 - 99 mg/dL 86   BUN 6 - 20 mg/dL 8   Creatinine 1.61 - 0.96 mg/dL 0.45   Sodium 409 - 811 mmol/L 139   Potassium 3.5 - 5.1 mmol/L 3.6   Chloride 98 - 111 mmol/L 109   CO2 22 - 32 mmol/L 25   Calcium 8.9 - 10.3 mg/dL 8.5       Latest Ref Rng & Units 03/14/2023    8:32 AM  CBC  WBC 4.0 - 10.5 K/uL 4.5   Hemoglobin 12.0 - 15.0 g/dL 91.4   Hematocrit  36.0 - 46.0 % 41.3   Platelets 150  - 400 K/uL 228     No images are attached to the encounter.  No results found.   Assessment and plan- Patient is a 57 y.o. female here for follow-up of following issues:  Iron deficiency anemia: Likely secondary to menorrhagia though she has never had GI work up which has been advised many times including today. Counts looks greatly improved since stopping her Eliquis. She will continue her oral iron supplement. RTC 2 months labs.   2.  History of left lower extremity DVT and subsegmental PE in September 2022.  Patient had a hypercoagulable workup done in the past which was negative.  She was previously on Eliquis. Pt preference to stop. Has been doing well and is asymptomatic. Blood counts have significantly improved off of the Eliquis which makes risk of use higher than being off of this medication however she is aware that there is a risk of clotting event. Should she experience another clotting episode I would suggest an alternative blood thinner.    Visit Diagnosis 1. Iron deficiency anemia, unspecified iron deficiency anemia type   2. History of DVT (deep vein thrombosis)   3. Uterine leiomyoma, unspecified location   4. PE (pulmonary thromboembolism) (HCC)      Jannetta Quint [facility] [phone] 03/16/2023 9:59 AM

## 2023-03-16 NOTE — Addendum Note (Signed)
Addended by: Corene Cornea on: 03/16/2023 10:48 AM   Modules accepted: Orders

## 2023-03-16 NOTE — Progress Notes (Signed)
Patient here for oncology follow-up appointment, expresses no complaints or concerns at this time.    

## 2023-05-28 ENCOUNTER — Other Ambulatory Visit: Payer: Managed Care, Other (non HMO)

## 2023-06-01 ENCOUNTER — Inpatient Hospital Stay: Payer: Managed Care, Other (non HMO)

## 2023-06-01 ENCOUNTER — Inpatient Hospital Stay: Payer: Managed Care, Other (non HMO) | Attending: Obstetrics and Gynecology | Admitting: Oncology

## 2023-06-01 ENCOUNTER — Encounter: Payer: Self-pay | Admitting: Oncology

## 2023-06-01 VITALS — BP 130/82 | HR 86 | Temp 96.3°F | Resp 18 | Wt 304.3 lb

## 2023-06-01 DIAGNOSIS — N920 Excessive and frequent menstruation with regular cycle: Secondary | ICD-10-CM | POA: Diagnosis not present

## 2023-06-01 DIAGNOSIS — Z86718 Personal history of other venous thrombosis and embolism: Secondary | ICD-10-CM | POA: Diagnosis not present

## 2023-06-01 DIAGNOSIS — D509 Iron deficiency anemia, unspecified: Secondary | ICD-10-CM | POA: Diagnosis not present

## 2023-06-01 DIAGNOSIS — Z803 Family history of malignant neoplasm of breast: Secondary | ICD-10-CM | POA: Diagnosis not present

## 2023-06-01 DIAGNOSIS — Z800 Family history of malignant neoplasm of digestive organs: Secondary | ICD-10-CM | POA: Diagnosis not present

## 2023-06-01 DIAGNOSIS — Z86711 Personal history of pulmonary embolism: Secondary | ICD-10-CM | POA: Insufficient documentation

## 2023-06-01 LAB — CBC WITH DIFFERENTIAL/PLATELET
Abs Immature Granulocytes: 0.01 10*3/uL (ref 0.00–0.07)
Basophils Absolute: 0 10*3/uL (ref 0.0–0.1)
Basophils Relative: 1 %
Eosinophils Absolute: 0.1 10*3/uL (ref 0.0–0.5)
Eosinophils Relative: 2 %
HCT: 40.1 % (ref 36.0–46.0)
Hemoglobin: 12.4 g/dL (ref 12.0–15.0)
Immature Granulocytes: 0 %
Lymphocytes Relative: 32 %
Lymphs Abs: 1.5 10*3/uL (ref 0.7–4.0)
MCH: 25.1 pg — ABNORMAL LOW (ref 26.0–34.0)
MCHC: 30.9 g/dL (ref 30.0–36.0)
MCV: 81 fL (ref 80.0–100.0)
Monocytes Absolute: 0.3 10*3/uL (ref 0.1–1.0)
Monocytes Relative: 6 %
Neutro Abs: 2.8 10*3/uL (ref 1.7–7.7)
Neutrophils Relative %: 59 %
Platelets: 245 10*3/uL (ref 150–400)
RBC: 4.95 MIL/uL (ref 3.87–5.11)
RDW: 16 % — ABNORMAL HIGH (ref 11.5–15.5)
WBC: 4.8 10*3/uL (ref 4.0–10.5)
nRBC: 0 % (ref 0.0–0.2)

## 2023-06-01 LAB — IRON AND TIBC
Iron: 46 ug/dL (ref 28–170)
Saturation Ratios: 13 % (ref 10.4–31.8)
TIBC: 350 ug/dL (ref 250–450)
UIBC: 304 ug/dL

## 2023-06-01 LAB — FERRITIN: Ferritin: 38 ng/mL (ref 11–307)

## 2023-06-01 NOTE — Progress Notes (Signed)
Hematology/Oncology Consult note [facility]  [phone] [phone]  Patient Care Team: Alm Bustard, NP as PCP - General (Family Medicine) Artelia Laroche, MD as Referring Physician (Obstetrics)   Name of the patient: Rhonda Mccall  [id number]  [birth year]   Date of visit: 06/01/23  Diagnosis- 1.  History of left lower extremity DVT in September 2022 2.  History of iron deficiency anemia  Chief complaint/ Reason for visit-routine follow-up of iron deficiency anemia  Heme/Onc history: Patient is a 57 year old African-American female with a past medical history significant for hypertension hyperlipidemia and type 2 diabetes who was noted to have subsegmental pulmonary embolus in September 2022 for which she was on anticoagulation up until March 2024 and then subsequently stopped it.  Patient was seen at the cancer center for iron deficiency anemia and has received IV iron.  This has been attributed to menorrhagia.  She has known history of uterine fibroids.  GI workup was recommended but patient is yet to follow-up with GI.  Interval history-patient states that she went for 4 months without menstrual cycles and after that she had menstrual cycle starting 3 weeks ago and have not yet stopped.  She is yet to see [facility] GI despite working for them.  Reports ongoing fatigue  ECOG PS- 1 Pain scale- 0   Review of systems- Review of Systems  Constitutional:  Positive for malaise/fatigue. Negative for chills, fever and weight loss.  HENT:  Negative for congestion, ear discharge and nosebleeds.   Eyes:  Negative for blurred vision.  Respiratory:  Negative for cough, hemoptysis, sputum production, shortness of breath and wheezing.   Cardiovascular:  Negative for chest pain, palpitations, orthopnea and claudication.  Gastrointestinal:  Negative for abdominal pain, blood in stool, constipation, diarrhea, heartburn, melena, nausea and  vomiting.  Genitourinary:  Negative for dysuria, flank pain, frequency, hematuria and urgency.  Musculoskeletal:  Negative for back pain, joint pain and myalgias.  Skin:  Negative for rash.  Neurological:  Negative for dizziness, tingling, focal weakness, seizures, weakness and headaches.  Endo/Heme/Allergies:  Does not bruise/bleed easily.  Psychiatric/Behavioral:  Negative for depression and suicidal ideas. The patient does not have insomnia.       Allergies  Allergen Reactions   Canagliflozin Other (See Comments)    Other reaction(s): Other (See Comments) Yeast infection Yeast infection    Metformin And Related Diarrhea   Sunscreens Hives   Tranexamic Acid Other (See Comments)   Victoza [Liraglutide] Other (See Comments)    Yeast infections     Past Medical History:  Diagnosis Date   Diabetes mellitus without complication (HCC)    Embolism and thrombosis (HCC) 05/2021   GERD (gastroesophageal reflux disease)    Hyperlipidemia    Hypertension    IDA (iron deficiency anemia)      Past Surgical History:  Procedure Laterality Date   BREAST BIOPSY Left    benign   BREAST BIOPSY Bilateral    benign   COLONOSCOPY WITH ESOPHAGOGASTRODUODENOSCOPY (EGD)     HYSTEROSCOPY WITH D & C N/A 05/02/2021   Procedure: DILATATION AND CURETTAGE /HYSTEROSCOPY;  Surgeon: Christeen Douglas, MD;  Location: [facility] ORS;  Service: Gynecology;  Laterality: N/A;    Social History   Socioeconomic History   Marital status: Single    Spouse name: Not on file   Number of children: Not on file   Years of education: Not on file   Highest education level: Not on file  Occupational History  Not on file  Tobacco Use   Smoking status: Never   Smokeless tobacco: Never  Vaping Use   Vaping status: Never Used  Substance and Sexual Activity   Alcohol use: No   Drug use: Never   Sexual activity: Not on file  Other Topics Concern   Not on file  Social History Narrative   Not on file   Social  Determinants of Health   Financial Resource Strain: Not on file  Food Insecurity: Not on file  Transportation Needs: Not on file  Physical Activity: Not on file  Stress: Not on file  Social Connections: Not on file  Intimate Partner Violence: Not on file    Family History  Problem Relation Age of Onset   Diabetes Mother    Hypertension Mother    Stroke Mother    Pancreatic cancer Father    Breast cancer Maternal Aunt    Breast cancer Maternal Aunt    Breast cancer Maternal Aunt    Breast cancer Cousin      Current Outpatient Medications:    amLODipine (NORVASC) 5 MG tablet, Take 5 mg by mouth daily., Disp: , Rfl:    aspirin 81 MG chewable tablet, , Disp: , Rfl:    Blood Glucose Monitoring Suppl (GLUCOCOM BLOOD GLUCOSE MONITOR) DEVI, 1 each by XX route as directed, Disp: , Rfl:    budesonide-formoterol (SYMBICORT) 160-4.5 MCG/ACT inhaler, Inhale into the lungs., Disp: , Rfl:    cholecalciferol (VITAMIN D3) 25 MCG (1000 UNIT) tablet, Take 5,000 Units by mouth daily., Disp: , Rfl:    cyanocobalamin (VITAMIN B12) 500 MCG tablet, Take 1,000 mcg by mouth daily., Disp: , Rfl:    dapagliflozin propanediol (FARXIGA) 10 MG TABS tablet, Take 10 mg by mouth daily., Disp: , Rfl:    Dulaglutide 1.5 MG/0.5ML SOPN, Inject 1.5 mg into the skin once a week., Disp: , Rfl:    ezetimibe (ZETIA) 10 MG tablet, Take 10 mg by mouth daily., Disp: , Rfl:    ferrous sulfate 300 (60 Fe) MG/5ML syrup, Take by mouth., Disp: , Rfl:    glimepiride (AMARYL) 4 MG tablet, Take 4 mg by mouth daily with breakfast., Disp: , Rfl:    glucose blood ([employer] BLOOD GLUCOSE TEST) test strip, , Disp: , Rfl:    losartan (COZAAR) 100 MG tablet, Take 100 mg by mouth daily., Disp: , Rfl:    MOUNJARO 12.5 MG/0.5ML Pen, Inject into the skin., Disp: , Rfl:    omeprazole (PRILOSEC) 40 MG capsule, Take 40 mg by mouth 2 (two) times daily., Disp: , Rfl:    rosuvastatin (CRESTOR) 20 MG tablet, Take 1 tablet (20 mg total) by mouth  daily., Disp: 30 tablet, Rfl: 0   apixaban (ELIQUIS) 5 MG TABS tablet, Take by mouth. (Patient not taking: Reported on 01/05/2023), Disp: , Rfl:    APIXABAN (ELIQUIS) VTE STARTER PACK (10MG  AND 5MG ), Take as directed on package: start with two-5mg  tablets twice daily for 7 days. On day 8, switch to one-5mg  tablet twice daily. (Patient not taking: Reported on 01/05/2023), Disp: 1 each, Rfl: 0   budesonide-formoterol (SYMBICORT) 80-4.5 MCG/ACT inhaler, Inhale into the lungs. (Patient not taking: Reported on 01/05/2023), Disp: , Rfl:    ergocalciferol (VITAMIN D2) 1.25 MG (50000 UT) capsule, Take by mouth. (Patient not taking: Reported on 06/01/2023), Disp: , Rfl:    Fluticasone-Umeclidin-Vilant 100-62.5-25 MCG/ACT AEPB, Inhale into the lungs. (Patient not taking: Reported on 01/05/2023), Disp: , Rfl:    gentamicin (GARAMYCIN) 0.3 %  ophthalmic solution, Place into the right eye. (Patient not taking: Reported on 01/05/2023), Disp: , Rfl:    Insulin Degludec 200 UNIT/ML SOPN, Inject 40 Units into the skin daily. (Patient not taking: Reported on 06/01/2023), Disp: , Rfl:    metFORMIN (GLUCOPHAGE) 1000 MG tablet, Take by mouth. (Patient not taking: Reported on 06/01/2023), Disp: , Rfl:    predniSONE (DELTASONE) 5 MG tablet, Take 2 tablets daily on days 7 through 9, then 1 tablet daily on days 10 through 14 (Patient not taking: Reported on 06/28/2021), Disp: , Rfl:   Physical exam:  Vitals:   06/01/23 1120  BP: 130/82  Pulse: 86  Resp: 18  Temp: (!) 96.3 F (35.7 C)  TempSrc: Tympanic  SpO2: 99%  Weight: (!) 304 lb 4.8 oz (138 kg)   Physical Exam Cardiovascular:     Rate and Rhythm: Normal rate and regular rhythm.     Heart sounds: Normal heart sounds.  Pulmonary:     Effort: Pulmonary effort is normal.     Breath sounds: Normal breath sounds.  Skin:    General: Skin is warm and dry.  Neurological:     Mental Status: She is alert and oriented to person, place, and time.         Latest Ref Rng &  Units 05/15/2021    4:20 AM  CMP  Glucose 70 - 99 mg/dL 86   BUN 6 - 20 mg/dL 8   Creatinine 2.84 - 1.32 mg/dL 4.40   Sodium 102 - 725 mmol/L 139   Potassium 3.5 - 5.1 mmol/L 3.6   Chloride 98 - 111 mmol/L 109   CO2 22 - 32 mmol/L 25   Calcium 8.9 - 10.3 mg/dL 8.5       Latest Ref Rng & Units 06/01/2023   10:57 AM  CBC  WBC 4.0 - 10.5 K/uL 4.8   Hemoglobin 12.0 - 15.0 g/dL 36.6   Hematocrit 44.0 - 46.0 % 40.1   Platelets 150 - 400 K/uL 245      Assessment and plan- Patient is a 57 y.o. female here for routine follow-up of iron deficiency anemia  Patient's hemoglobin is improved to 12.4 after receiving IV iron.After the patient left the clinic her iron studies came back with a ferritin of 38 and an iron saturation of 13%.  She has had ongoing menstrual cycles for the last 3 weeks.  I think it would be okay to proceed with more IV iron at this time.  I have asked her to get in touch with GYN about regulating her menstrual cycles.  I also asked her to reach out to [facility] regarding GI workup for iron deficiency anemia.  Moreover patient is 61 and has never had a colonoscopy.  CBC Wilden and iron studies in 3 and 6 months and I will see her back in 6 months   Visit Diagnosis 1. Iron deficiency anemia, unspecified iron deficiency anemia type      Dr. Owens Shark, MD, MPH [facility] [phone] 06/01/2023 12:52 PM

## 2023-06-08 ENCOUNTER — Inpatient Hospital Stay: Payer: Managed Care, Other (non HMO)

## 2023-06-15 ENCOUNTER — Inpatient Hospital Stay: Payer: Managed Care, Other (non HMO)

## 2023-06-19 ENCOUNTER — Telehealth: Payer: Self-pay | Admitting: *Deleted

## 2023-06-19 NOTE — Telephone Encounter (Signed)
Rhonda Mccall will call her and see if pt still wants the IV iron

## 2023-06-22 ENCOUNTER — Inpatient Hospital Stay: Payer: Managed Care, Other (non HMO)

## 2023-07-27 ENCOUNTER — Ambulatory Visit
Admission: RE | Admit: 2023-07-27 | Discharge: 2023-07-27 | Disposition: A | Discharge status: Home / Self Care | Payer: Managed Care, Other (non HMO) | Source: Ambulatory Visit | Attending: Certified Nurse Midwife | Admitting: Certified Nurse Midwife

## 2023-07-27 DIAGNOSIS — Z1231 Encounter for screening mammogram for malignant neoplasm of breast: Secondary | ICD-10-CM | POA: Diagnosis present

## 2023-08-31 ENCOUNTER — Inpatient Hospital Stay: Payer: Managed Care, Other (non HMO) | Attending: Obstetrics and Gynecology

## 2023-11-29 ENCOUNTER — Other Ambulatory Visit: Payer: Self-pay

## 2023-11-29 DIAGNOSIS — D509 Iron deficiency anemia, unspecified: Secondary | ICD-10-CM

## 2023-11-30 ENCOUNTER — Encounter: Payer: Self-pay | Admitting: Oncology

## 2023-11-30 ENCOUNTER — Inpatient Hospital Stay: Payer: Managed Care, Other (non HMO) | Admitting: Oncology

## 2023-11-30 ENCOUNTER — Inpatient Hospital Stay: Payer: Managed Care, Other (non HMO) | Attending: Internal Medicine

## 2024-02-07 ENCOUNTER — Other Ambulatory Visit: Payer: Self-pay | Admitting: Family Medicine

## 2024-02-07 DIAGNOSIS — Z9189 Other specified personal risk factors, not elsewhere classified: Secondary | ICD-10-CM

## 2024-02-07 DIAGNOSIS — Z0000 Encounter for general adult medical examination without abnormal findings: Secondary | ICD-10-CM

## 2024-02-20 ENCOUNTER — Ambulatory Visit
Admission: RE | Admit: 2024-02-20 | Discharge: 2024-02-20 | Disposition: A | Discharge status: Home / Self Care | Payer: Self-pay | Source: Ambulatory Visit | Attending: Family Medicine | Admitting: Family Medicine

## 2024-02-20 DIAGNOSIS — Z9189 Other specified personal risk factors, not elsewhere classified: Secondary | ICD-10-CM | POA: Insufficient documentation

## 2024-02-20 DIAGNOSIS — Z0000 Encounter for general adult medical examination without abnormal findings: Secondary | ICD-10-CM | POA: Insufficient documentation

## 2024-07-14 ENCOUNTER — Other Ambulatory Visit: Payer: Self-pay | Admitting: Neurology

## 2024-07-14 DIAGNOSIS — G44221 Chronic tension-type headache, intractable: Secondary | ICD-10-CM

## 2024-07-14 DIAGNOSIS — R519 Headache, unspecified: Secondary | ICD-10-CM

## 2024-07-19 ENCOUNTER — Ambulatory Visit
Admission: RE | Admit: 2024-07-19 | Discharge: 2024-07-19 | Disposition: A | Source: Ambulatory Visit | Attending: Neurology | Admitting: Neurology

## 2024-07-19 DIAGNOSIS — G44221 Chronic tension-type headache, intractable: Secondary | ICD-10-CM

## 2024-07-19 DIAGNOSIS — R519 Headache, unspecified: Secondary | ICD-10-CM

## 2024-07-25 ENCOUNTER — Other Ambulatory Visit: Payer: Self-pay | Admitting: Certified Nurse Midwife

## 2024-07-25 DIAGNOSIS — Z1231 Encounter for screening mammogram for malignant neoplasm of breast: Secondary | ICD-10-CM

## 2024-09-22 ENCOUNTER — Ambulatory Visit
Admission: RE | Admit: 2024-09-22 | Discharge: 2024-09-22 | Disposition: A | Discharge status: Home / Self Care | Source: Ambulatory Visit | Attending: Certified Nurse Midwife | Admitting: Certified Nurse Midwife

## 2024-09-22 DIAGNOSIS — Z1231 Encounter for screening mammogram for malignant neoplasm of breast: Secondary | ICD-10-CM | POA: Insufficient documentation
